# Patient Record
Sex: Male | Born: 1940 | Race: Black or African American | Hispanic: No | Marital: Married | Smoking: Never smoker
Health system: Southern US, Community
[De-identification: ages and names within clinical notes are randomized; demographics above are authoritative.]

## PROBLEM LIST (undated history)

## (undated) DIAGNOSIS — E119 Type 2 diabetes mellitus without complications: Secondary | ICD-10-CM

## (undated) DIAGNOSIS — E039 Hypothyroidism, unspecified: Secondary | ICD-10-CM

## (undated) HISTORY — PX: HERNIA REPAIR: SHX51

## (undated) HISTORY — PX: APPENDECTOMY: SHX54

## (undated) HISTORY — PX: THYROID SURGERY: SHX805

---

## 2006-06-01 ENCOUNTER — Emergency Department (HOSPITAL_COMMUNITY): Admission: EM | Admit: 2006-06-01 | Discharge: 2006-06-01 | Payer: Self-pay | Admitting: Emergency Medicine

## 2007-03-09 ENCOUNTER — Ambulatory Visit (HOSPITAL_COMMUNITY): Admission: RE | Admit: 2007-03-09 | Discharge: 2007-03-10 | Payer: Self-pay | Admitting: Urology

## 2007-03-21 ENCOUNTER — Inpatient Hospital Stay (HOSPITAL_COMMUNITY): Admission: EM | Admit: 2007-03-21 | Discharge: 2007-03-26 | Payer: Self-pay | Admitting: Emergency Medicine

## 2008-03-03 ENCOUNTER — Ambulatory Visit (HOSPITAL_BASED_OUTPATIENT_CLINIC_OR_DEPARTMENT_OTHER): Admission: RE | Admit: 2008-03-03 | Discharge: 2008-03-03 | Payer: Self-pay | Admitting: Urology

## 2010-11-05 NOTE — Op Note (Signed)
NAME:  [name], BRANDER NO.:  [id number]   MEDICAL RECORD NO.:  [id number]          PATIENT TYPE:  AMB   LOCATION:  [facility]                         FACILITY:  [facility]   PHYSICIAN:  Sigmund I. Patsi Sears, M.D.DATE OF BIRTH:  [birth year]   DATE OF PROCEDURE:  03/03/2008  DATE OF DISCHARGE:                               OPERATIVE REPORT   PREOPERATIVE DIAGNOSIS:  Malfunctioning three-piece inflatable penile  prosthesis.   POSTOPERATIVE DIAGNOSIS:  Malfunctioning three-piece inflatable penile  prosthesis, history of Peyronie's disease and organic erectile  dysfunction.   ATTENDING PHYSICIAN:  Sigmund I. Patsi Sears, M.D.   RESIDENT PHYSICIAN:  Dr. Delman Kitten.   ANESTHESIA:  General plus local.   INDICATIONS FOR PROCEDURE:  Mr. [name] is a 70 year old male with a  history of erectile dysfunction resistant to PD5 inhibitors as well as  testosterone supplementation for hypogonadism.  He also has a history of  diabetes.  He failed injection therapy as well.  He elected to have a  three-piece Mentor inflatable prosthesis placed.  This was done over a  year ago.  Postoperative course was complicated by pyelonephritis.  He  was unhappy with his urologist who implanted this device and likewise  saw Dr. Patsi Sears for a second opinion.  He complained that he had  persistent scrotal discomfort as well as had significant difficulty  inflating the prosthesis.  He was seen in clinic and there Dr.  Patsi Sears and Dr. Brunilda Payor both had difficulty inflating the prosthesis and  it was found that the corporal cylinders were slightly proximal to the  glans penis possibly secondary to cylinder migration.  After discussion  with the patient about his options [name] elected to proceed forth  with explantation of his inflatable prosthesis with reimplantation of a  Mentor malleable prosthesis, concomitantly.  Discussion of risks,  benefits, consequences and concerns.  Informed consent was  obtained.   PROCEDURE IN DETAIL:  The patient was brought to the operating room and  placed in a supine position.  He was correctly identified by his  wristband and appropriate time-out was taken.  IV antibiotics were  administered and general anesthesia was delivered.  Once adequately  anesthetized his groin was clipped, prepped and draped in normal sterile  fashion.  This included a standard 10 minute scrub.  We began by placing  a 16 French Foley catheter per urethra into his bladder and drained  clear urine.  We then reevaluated him under anesthesia.  It did appear  as if the cylinders were both of equal length and both extended to the  corona of his glans when properly inflated.  Under anesthesia we did not  have any difficulty fully inflating it.  However, there did not appear  to be crossover or any other defect in the prosthesis.  We then made our  infrapubic incision through the prior scar.  We carried this down  sharply and bluntly and with Bovie electrocautery.  We identified all  three sets of tubing for the prosthesis.  We began deeper dissection by  following the tubing going to the reservoir.  This was fairly  deep and  quite scarred in.  The defect in the transversalis fascia was almost  completely closed.  Dissecting the reservoir out took a fair amount of  time and diligence.  Once we were able to get into the transversalis  fascia it was opened with a fingertip and enlarged bluntly.  The  reservoir was removed in its entirety.  All bleeding sites were then  addressed with Bovie electrocautery.  This wound was irrigated copiously  with antibiotic solution and the fascial defect was closed with multiple  sutures using 2-0 Vicryl.  We then turned our attention to the pump.   We isolated the two sets of tubing going down to it and again used Bovie  electrocautery to divide the tissues.  We then delivered the pump into  the field as well.  We then followed the tubing going  to each corporal  cylinder and subsequently used Bovie electrocautery to divide all soft  tissues leading to each one of them.  We enlarged both corporotomies and  subsequently delivered both corporal cylinders.  Now that we had  completely explanted the device was passed off the table.  We copiously  irrigated the reservoir site, the right hemiscrotal pump site as well as  both corporal channels with antibiotic solution.  We then changed gloves  as did the scrub nurse.  We then proceeded forth with our measurements,  first the right then the left.  Both measured 20 cm total, approximately  10 cm proximal and 10 cm distal.  We elected to start with a 20 cm  Coloplast semi-rigid penile prosthesis.  We had to extend our  corporotomies on both sides to allow [place] to place our prosthetic  cylinders.  After both sides were placed it appeared as if the distal  tips did not extend far enough out into his phallus and left him a  little short, similar to the location where his IPP tips were.  Likewise  we removed both cylinders and removed both malleable devices and used  the 0.5 cm rear tip extenders for a total of 20.5 cm bilaterally.  We  replaced these into their respective corporal channels and we got the  fit that we were looking for.  There was no bowing or flexing proximally  nor was there excessive tension on the distal penile skin.  We  subsequently irrigated our entire operative field with antibiotic  solution.  We closed our corporotomies with running 2-0 Vicryl and then  closed our wound in three layers, deep layer reapproximated all the  tissues with 4-0 Vicryl.  The adipose/Scarpa's layer was closed with  multiple simple interrupted sutures using 4-0 Vicryl and a few single  interrupted sutures using 2-0 Vicryl.  The skin was then closed with  running 4-0 Monocryl subcuticular and then dressed with Benzoin and  Steri-Strips.  A fluff dressing and scrotal support were then applied.   This marked the end of our procedure.  He awoke from anesthesia and was  taken to the recovery room in stable condition.  There were no  complications.  He tolerated the procedure well.  Dr. Patsi Sears was  present and participated in all aspects of the case.     ______________________________  Dr [place] Constable I. Patsi Sears, M.D.  Electronically Signed    DW/MEDQ  D:  03/03/2008  T:  03/04/2008  Job:  [id number]

## 2010-11-05 NOTE — H&P (Signed)
[name], [name] NO.:  [id number]   MEDICAL RECORD NO.:  [id number]          PATIENT TYPE:  INP   LOCATION:  1424                         FACILITY:  [facility]   PHYSICIAN:  Theresia Bough, MD       DATE OF BIRTH:  06-07-1941   DATE OF ADMISSION:  03/20/2007  DATE OF DISCHARGE:                              HISTORY & PHYSICAL   PRESENTING COMPLAINT:  Fever.   HISTORY OF THE PRESENT ILLNESS:  This is a 70 year old black male  patient who comes into the hospital because of a fever.  He has also  been having some weakness.  His temperature is 101.4 when he came into  the hospital.  He states he has been feeling tired and uncomfortable  since this morning.  The patient states he had surgery for a penile  implant on March 09, 2007.  The patient complains of elevated blood  glucose; he has a history of diabetes.  He denies nausea; and, has had  no vomiting and no diarrhea.  No chest pain.  No shortness of breath.  No abdominal pain.  The patient denies any dysuria and denies cyanosis.  No leg swelling.  No edema.   PAST MEDICAL HISTORY:  1. Diabetes mellitus Type 2.  2. Hypertension.   FAMILY HISTORY:  The family history is noncontributory.   ALLERGIES:  The patient has an allergy to SULFA.   SOCIAL HISTORY:  The patient denies any drug use or alcohol use;  however, is an occasional drinker.   CURRENT MEDICATIONS:  The patient takes Lantus and Benicar; however, he  does not remember the dosages.  He does not remember any other  medications.   PHYSICAL EXAMINATION:  VITAL SIGNS:  The physical examination shows a  temperature of 99.1-101.4, blood pressure 92/59 now and up to 121/61.  HEAD AND NECK:  The patient has pink conjunctivae.  He has no jaundice.  The neck is supple.  Mucosa is moist.  CHEST:  The chest is clear to auscultation bilaterally.  No wheezes and  no crackles.  HEART:  Cardiovascular shows normal heart sounds.  ABDOMEN:  The abdomen is soft  and nontender.  No hepatosplenomegaly.  The patient has normal bowel sounds.  EXTREMITIES:  The extremities reveal no edema.  No cyanosis or clubbing.  NEUROLOGIC EXAMINATION:  The patient is alert and oriented times three.  Speech is clear.  Power is 4/4 in all extremities.   LABORATORY DATA:  The initial blood work shows WBC of 23,000, hemoglobin  12 and platelets 270,000.  BUN 26, creatinine 2.7, glucose 290, which is  elevated, chloride 100, bicarb 24, sodium 133, and potassium 4.9.  Chest  x-ray shows minimal bronchitic changes.  Troponin level 0.05, CK/MB less  than 1.0 and myoglobin 90.   ASSESSMENT:  1. Urosepsis.  2. Renal insufficiency.  3. Leukocytosis.  4. Diabetes mellitus, uncontrolled.   PLAN:  My plan is to:  1. Admit the patient to a telemetry floor.  2. I am going to hydrate the patient with normal saline to see if the  creatinine  level will decrease.  3. I will also do a renal ultrasound in the A.M. to assess the      kidneys.  4. The patient will be started on IV Rocephin 1 gram 24 hours.  5. The patient will also be on Lantus and sliding scale insulin in      order to control his diabetes.  6. The patient will be placed on strict I&Os in order to follow the      patient's renal status.  7. I will also obtain a list of the patient's home medications in the      A.M. so that we can continue them ASAP.  8. Also I will have urine and blood cultures done.  9. We will also follow the patient's WBC.      Theresia Bough, MD  Electronically Signed     GA/MEDQ  D:  03/21/2007  T:  03/21/2007  Job:  [id number]

## 2010-11-05 NOTE — Op Note (Signed)
NAMEOSMANI, KERSTEN NO.:  [id number]   MEDICAL RECORD NO.:  [id number]          PATIENT TYPE:  AMB   LOCATION:  DAY                          FACILITY:  [facility]   PHYSICIAN:  Jamison Neighbor, M.D.  DATE OF BIRTH:  [birth year]   DATE OF PROCEDURE:  03/09/2007  DATE OF DISCHARGE:                               OPERATIVE REPORT   PREOPERATIVE DIAGNOSIS:  Erectile dysfunction.   POSTOPERATIVE DIAGNOSES:  1. Erectile dysfunction.  2. Peyronie disease.   PROCEDURE:  Infrapubic implant of inflatable penile prosthesis, three-  piece, with penile remodeling.   SURGEON:  Jamison Neighbor, MD   ASSISTANT:  Melina Schools, MD   ANESTHESIA:  General.   INDICATIONS FOR PROCEDURE:  Mr. Vacha is a 70 year old male with  diabetes and hypogonadism.  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  He has also failed injections.  He has elected for  inflatable penile prosthesis.   DESCRIPTION OF PROCEDURE IN DETAIL:  The patient was brought to the  operating room.  He was identified by his arm band.  Informed consent  was verified and the preoperative time-out was performed.  After the  successful induction of general anesthesia, the patient underwent a  shave of his genitalia and perineal region.  A 10-minute preoperative  scrub was undertaken.  Then the operative site was prepped and draped in  the usual fashion with Betadine.  A Foley catheter was inserted  transurethrally in the bladder and the bladder was drained.  A 6-cm  Infrapubic incision was then made.  Blunt and sharp dissection were used  to carry this down to the level of the corpora.  We placed stay sutures  in the corpora bilaterally and made a 1-cm arthrotomy.  We dilated  proximally with Metzenbaums and distally as well.  We then used  sequential Hegar dilators to dilate to a size of 14 [place].  Great care  was taken not to perforate  posteriorly or cross-dilate.  Once dilation  was complete on both sides, we used the measuring tool to select the  appropriate size.  Our dilation measured 18 cm on both sides.  Therefore  we used a 16-cm device with the 2-cm rear-tip extenders.  Once we had  dilated, we placed preplaced closing sutures in the corpora bilaterally  using 2-0 Vicryl.  With the dilation complete, we tested with putting  two dilators in both proximal and then in both distal dilations and  there was no cross-perforation.  We then created the space for the  reservoir by using blunt and sharp dissection anterior to the internal  ring and perforate the peritoneum.  We then developed the scrotal pump  site using blunt dissection to a dependent portion of the right  hemiscrotum.  Having done all this, we proceeded to implant of the  device.  The wound was vigorously irrigated with antibiotic solution.  We then placed the above-mentioned device using the [facility] tool.  We  passed the [place] needles through the glans and then placed the posterior  expanders.  We then placed the reservoir under direct vision through the  internal ring.  We then placed the pump in the scrotum.  We used the  connector.  We tested the device and there was no reservoir auto-fill.  The cylinders field excellently.  We then again irrigated.  The wound  was closed in two layers with 2-0 Vicryl and the skin was closed with  running 4-0 Vicryl in a subcuticular fashion.  It was dressed with an  occlusive dressing.  The patient tolerated the procedure well and there  were no complications.  Jamison Neighbor, M.D., was the attending and  primary responsible physician, present and participating in all aspects  of the procedure.   DISPOSITION:  The patient was awoken from general anesthetic and  transferred safely to the post anesthesia care unit in stable condition.     ______________________________  Melina Schools, MD      Jamison Neighbor,  M.D.  Electronically Signed    JR/MEDQ  D:  03/09/2007  T:  03/09/2007  Job:  [id number]

## 2010-11-08 NOTE — Discharge Summary (Signed)
NAMECHRISTY, [name] NO.:  [id number]   MEDICAL RECORD NO.:  [id number]          PATIENT TYPE:  INP   LOCATION:  1424                         [facility]ACILITY:  [facility]   PHYSICIAN:  Michaelyn Barter, M.D. DATE OF BIRTH:  1941-06-15   DATE OF ADMISSION:  03/20/2007  DATE OF DISCHARGE:  03/26/2007                               DISCHARGE SUMMARY   PRIMARY CARE PHYSICIAN:  Unassigned.   FINAL DIAGNOSIS:  Pyelonephritis with urine cultures positive for  Pseudomonas.   PROCEDURES:  1. Chest x-ray, two views, completed March 20, 2007.  2. His renal ultrasound completed March 21, 2007.   CONSULTATIONS:  Urology with Dr. Logan Bores   HISTORY OF PRESENT ILLNESS:  [name] is a 70 year old gentleman who  indicated that he had developed a fever and began to experience weakness  while at home.  He had recently undergone a penile implant surgery on  September 16.  He also stated that his sugars have been running somewhat  high at home.  For past medical history, please see that dictated by Dr.  __________ .   HOSPITAL COURSE:  1. Pyelonephritis.  The patient had a urinalysis completed on      September 27.  It revealed moderate leukocytes.  Wbc's were too      numerous to count.  He was started on empiric IV Rocephin.  A urine      culture later grew Pseudomonas aeruginosa.  Over the course of      hospitalization,  his fevers resolved.  A renal ultrasound was      completed on September 28 which revealed small renal cyst      bilaterally.  No acute findings.  2. Recent penile prosthesis implantation secondary to erectile      dysfunction.  Urology was consulted and Dr. Logan Bores saw the patient.      At one point in time, Dr. Logan Bores indicated the patient was at risk      for infection of his prosthesis; therefore, he recommended close      observation.  No further surgical intervention was pursued during      this hospitalization.  3. Diabetes mellitus.  The patient's sugars  were poorly controlled.      The patient indicated that he had a much better control of his      sugars while he was at home than  during this hospitalization.  His      Lantus insulin was titrated over the course of hospitalization in      an effort to better controlled his sugars, although by the date of      discharge they were not perfectly controlled.  His hemoglobin A1c      was found to be 8.5.  4. Renal insufficiency.  The patient's BUN was noted to have been 26      with a creatinine of 2.7 at the time of admission.  There may have      been a prerenal component to this with IV fluid hydration.  The      patient's BUN and creatinine both improved  significantly by the day      of discharge so much so that  by October 1, the patient's BUN had      improved to 10 with a creatinine of 1.13.  By the date of      discharge, the patient indicated that he was ready to go home.  He      stated that he felt really good.  His temperature is 98.7, heart      rate 88, respirations 18, blood pressure 149/86, O2 sat 98% on room      air.  The decision was made to discharge the patient home from the      hospital.   DISCHARGE MEDICATIONS:  The patient was discharged on:  1. Ciprofloxacin 250 mg p.o. q.12h.  He was given a prescription for 2      weeks supply.  2. Lantus insulin 20 units at bedtime.  3. Benicar 20 mg p.o. daily.  4. Levoxyl 0.125 mg p.o. daily.  5. Lovastatin 40 mg p.o. daily.   FOLLOW UP:  He was told to call his regular doctor within 2-4 weeks for  follow-up and to call Dr. Logan Bores for follow-up appointment which would  take place on April 05, 2007.  This is      Michaelyn Barter, M.D.  Electronically Signed     OR/MEDQ  D:  04/25/2007  T:  04/26/2007  Job:  [id number]

## 2011-03-26 LAB — GLUCOSE, CAPILLARY
Glucose-Capillary: 175 — ABNORMAL HIGH
Glucose-Capillary: 197 — ABNORMAL HIGH

## 2011-03-26 LAB — POCT I-STAT 4, (NA,K, GLUC, HGB,HCT)
Glucose, Bld: 104 — ABNORMAL HIGH
Hemoglobin: 13.9

## 2011-04-03 LAB — CULTURE, BLOOD (ROUTINE X 2): Culture: NO GROWTH

## 2011-04-03 LAB — URINALYSIS, ROUTINE W REFLEX MICROSCOPIC
Glucose, UA: NEGATIVE
Nitrite: NEGATIVE
Specific Gravity, Urine: 1.022
pH: 5

## 2011-04-03 LAB — CBC
HCT: 33.7 — ABNORMAL LOW
HCT: 33.9 — ABNORMAL LOW
HCT: 35 — ABNORMAL LOW
Hemoglobin: 11.2 — ABNORMAL LOW
Hemoglobin: 11.4 — ABNORMAL LOW
Hemoglobin: 11.6 — ABNORMAL LOW
Hemoglobin: 10.8 — ABNORMAL LOW
Hemoglobin: 11.1 — ABNORMAL LOW
MCHC: 33.6
MCHC: 33.9
MCHC: 33.9
MCV: 88
MCV: 87.7
Platelets: 308
Platelets: 223
Platelets: 248
RBC: 3.82 — ABNORMAL LOW
RBC: 3.85 — ABNORMAL LOW
RBC: 3.59 — ABNORMAL LOW
RDW: 14
RDW: 14.2 — ABNORMAL HIGH
RDW: 13.9
RDW: 14
RDW: 14.6 — ABNORMAL HIGH
WBC: 10.5
WBC: 19.1 — ABNORMAL HIGH
WBC: 25 — ABNORMAL HIGH

## 2011-04-03 LAB — DIFFERENTIAL
Basophils Absolute: 0
Basophils Absolute: 0
Basophils Absolute: 0
Basophils Relative: 0
Eosinophils Relative: 0
Lymphocytes Relative: 10 — ABNORMAL LOW
Lymphocytes Relative: 10 — ABNORMAL LOW
Lymphocytes Relative: 4 — ABNORMAL LOW
Lymphocytes Relative: 6 — ABNORMAL LOW
Lymphs Abs: 0.9
Lymphs Abs: 1.8
Monocytes Absolute: 1.1 — ABNORMAL HIGH
Monocytes Absolute: 1.4 — ABNORMAL HIGH
Monocytes Relative: 6
Monocytes Relative: 7
Monocytes Relative: 8
Neutro Abs: 10.3 — ABNORMAL HIGH
Neutro Abs: 15.8 — ABNORMAL HIGH
Neutro Abs: 20.7 — ABNORMAL HIGH
Neutro Abs: 21.5 — ABNORMAL HIGH
Neutrophils Relative %: 80 — ABNORMAL HIGH
Neutrophils Relative %: 86 — ABNORMAL HIGH

## 2011-04-03 LAB — BASIC METABOLIC PANEL
BUN: 29 — ABNORMAL HIGH
Calcium: 8.7
Creatinine, Ser: 1.13
Creatinine, Ser: 2.02 — ABNORMAL HIGH
GFR calc Af Amer: >60
GFR calc non Af Amer: >60
GFR calc non Af Amer: 33 — ABNORMAL LOW
Glucose, Bld: 157 — ABNORMAL HIGH
Glucose, Bld: 193 — ABNORMAL HIGH
Potassium: 4.7
Sodium: 140

## 2011-04-03 LAB — COMPREHENSIVE METABOLIC PANEL
ALT: 49
AST: 37
CO2: 24
Calcium: 8.7
Creatinine, Ser: 2.7 — ABNORMAL HIGH
GFR calc Af Amer: 29 — ABNORMAL LOW
GFR calc non Af Amer: 24 — ABNORMAL LOW
Sodium: 133 — ABNORMAL LOW
Total Protein: 6.7

## 2011-04-03 LAB — POCT CARDIAC MARKERS
CKMB, poc: <1 — ABNORMAL LOW
Myoglobin, poc: 395
Operator id: 1211

## 2011-04-03 LAB — URINE MICROSCOPIC-ADD ON

## 2011-04-03 LAB — HEMOGLOBIN A1C: Hgb A1c MFr Bld: 8.5 — ABNORMAL HIGH

## 2011-04-03 LAB — URINE CULTURE

## 2011-04-04 LAB — NO BLOOD PRODUCTS

## 2011-04-04 LAB — URINALYSIS, ROUTINE W REFLEX MICROSCOPIC
Bilirubin Urine: NEGATIVE
Hgb urine dipstick: NEGATIVE
Specific Gravity, Urine: 1.019
pH: 6

## 2011-04-04 LAB — BASIC METABOLIC PANEL
CO2: 28
GFR calc Af Amer: >60
GFR calc non Af Amer: 53 — ABNORMAL LOW
Glucose, Bld: 128 — ABNORMAL HIGH
Potassium: 4.3
Sodium: 139

## 2011-04-04 LAB — HEMOGLOBIN AND HEMATOCRIT, BLOOD: HCT: 38.8 — ABNORMAL LOW

## 2014-09-19 ENCOUNTER — Emergency Department (HOSPITAL_COMMUNITY): Payer: Medicare Other

## 2014-09-19 ENCOUNTER — Encounter (HOSPITAL_COMMUNITY): Payer: Self-pay | Admitting: *Deleted

## 2014-09-19 ENCOUNTER — Encounter (HOSPITAL_COMMUNITY)
Admission: EM | Disposition: A | Discharge status: Home / Self Care | Payer: Medicare Other | Source: Home / Self Care | Attending: Cardiovascular Disease

## 2014-09-19 ENCOUNTER — Other Ambulatory Visit: Payer: Self-pay

## 2014-09-19 ENCOUNTER — Inpatient Hospital Stay (HOSPITAL_COMMUNITY)
Admission: EM | Admit: 2014-09-19 | Discharge: 2014-09-21 | DRG: 247 | Disposition: A | Discharge status: Home / Self Care | Payer: Medicare Other | Attending: Cardiovascular Disease | Admitting: Cardiovascular Disease

## 2014-09-19 DIAGNOSIS — I2510 Atherosclerotic heart disease of native coronary artery without angina pectoris: Secondary | ICD-10-CM

## 2014-09-19 DIAGNOSIS — E1165 Type 2 diabetes mellitus with hyperglycemia: Secondary | ICD-10-CM | POA: Diagnosis present

## 2014-09-19 DIAGNOSIS — I10 Essential (primary) hypertension: Secondary | ICD-10-CM | POA: Diagnosis present

## 2014-09-19 DIAGNOSIS — E785 Hyperlipidemia, unspecified: Secondary | ICD-10-CM | POA: Diagnosis present

## 2014-09-19 DIAGNOSIS — I2119 ST elevation (STEMI) myocardial infarction involving other coronary artery of inferior wall: Secondary | ICD-10-CM | POA: Diagnosis present

## 2014-09-19 DIAGNOSIS — E871 Hypo-osmolality and hyponatremia: Secondary | ICD-10-CM | POA: Diagnosis present

## 2014-09-19 DIAGNOSIS — I472 Ventricular tachycardia: Secondary | ICD-10-CM | POA: Diagnosis not present

## 2014-09-19 DIAGNOSIS — Z881 Allergy status to other antibiotic agents status: Secondary | ICD-10-CM | POA: Diagnosis not present

## 2014-09-19 DIAGNOSIS — E039 Hypothyroidism, unspecified: Secondary | ICD-10-CM | POA: Diagnosis present

## 2014-09-19 DIAGNOSIS — I2121 ST elevation (STEMI) myocardial infarction involving left circumflex coronary artery: Secondary | ICD-10-CM | POA: Diagnosis not present

## 2014-09-19 DIAGNOSIS — E119 Type 2 diabetes mellitus without complications: Secondary | ICD-10-CM | POA: Diagnosis not present

## 2014-09-19 DIAGNOSIS — Z794 Long term (current) use of insulin: Secondary | ICD-10-CM

## 2014-09-19 DIAGNOSIS — I213 ST elevation (STEMI) myocardial infarction of unspecified site: Secondary | ICD-10-CM

## 2014-09-19 DIAGNOSIS — R739 Hyperglycemia, unspecified: Secondary | ICD-10-CM

## 2014-09-19 HISTORY — PX: CARDIAC CATHETERIZATION: SHX172

## 2014-09-19 HISTORY — DX: Type 2 diabetes mellitus without complications: E11.9

## 2014-09-19 HISTORY — DX: Hypothyroidism, unspecified: E03.9

## 2014-09-19 HISTORY — PX: PERCUTANEOUS CORONARY STENT INTERVENTION (PCI-S): SHX5485

## 2014-09-19 LAB — CBC
HCT: 36.1 % — ABNORMAL LOW (ref 39.0–52.0)
HCT: 35.6 % — ABNORMAL LOW (ref 39.0–52.0)
Hemoglobin: 12.2 g/dL — ABNORMAL LOW (ref 13.0–17.0)
Hemoglobin: 12.2 g/dL — ABNORMAL LOW (ref 13.0–17.0)
MCH: 30.6 pg (ref 26.0–34.0)
MCH: 30.7 pg (ref 26.0–34.0)
MCHC: 33.8 g/dL (ref 30.0–36.0)
MCHC: 34.3 g/dL (ref 30.0–36.0)
MCV: 90.5 fL (ref 78.0–100.0)
MCV: 89.4 fL (ref 78.0–100.0)
PLATELETS: 190 10*3/uL (ref 150–400)
Platelets: 180 10*3/uL (ref 150–400)
RBC: 3.99 MIL/uL — AB (ref 4.22–5.81)
RBC: 3.98 MIL/uL — AB (ref 4.22–5.81)
RDW: 12.3 % (ref 11.5–15.5)
RDW: 12.4 % (ref 11.5–15.5)
WBC: 7.4 10*3/uL (ref 4.0–10.5)
WBC: 6.8 10*3/uL (ref 4.0–10.5)

## 2014-09-19 LAB — BRAIN NATRIURETIC PEPTIDE: B Natriuretic Peptide: 26 pg/mL (ref 0.0–100.0)

## 2014-09-19 LAB — TSH: TSH: 0.412 u[IU]/mL (ref 0.350–4.500)

## 2014-09-19 LAB — BASIC METABOLIC PANEL
ANION GAP: 9 (ref 5–15)
BUN: 19 mg/dL (ref 6–23)
CALCIUM: 9 mg/dL (ref 8.4–10.5)
CHLORIDE: 97 mmol/L (ref 96–112)
CO2: 25 mmol/L (ref 19–32)
Creatinine, Ser: 1.29 mg/dL (ref 0.50–1.35)
GFR, EST AFRICAN AMERICAN: 61 mL/min — AB (ref >90)
GFR, EST NON AFRICAN AMERICAN: 53 mL/min — AB (ref >90)
GLUCOSE: 359 mg/dL — AB (ref 70–99)
Potassium: 3.8 mmol/L (ref 3.5–5.1)
SODIUM: 131 mmol/L — AB (ref 135–145)

## 2014-09-19 LAB — GLUCOSE, CAPILLARY
GLUCOSE-CAPILLARY: 312 mg/dL — AB (ref 70–99)
GLUCOSE-CAPILLARY: 255 mg/dL — AB (ref 70–99)
GLUCOSE-CAPILLARY: 190 mg/dL — AB (ref 70–99)
Glucose-Capillary: 182 mg/dL — ABNORMAL HIGH (ref 70–99)

## 2014-09-19 LAB — CREATININE, SERUM
Creatinine, Ser: 1.22 mg/dL (ref 0.50–1.35)
GFR calc non Af Amer: 57 mL/min — ABNORMAL LOW (ref >90)
GFR, EST AFRICAN AMERICAN: 66 mL/min — AB (ref >90)

## 2014-09-19 LAB — TROPONIN I
Troponin I: 8.8 ng/mL (ref <0.031)
Troponin I: 50.87 ng/mL (ref <0.031)
Troponin I: 42.95 ng/mL (ref <0.031)

## 2014-09-19 LAB — POCT ACTIVATED CLOTTING TIME: Activated Clotting Time: 362 seconds

## 2014-09-19 SURGERY — LEFT HEART CATH AND CORONARY ANGIOGRAPHY

## 2014-09-19 MED ORDER — SODIUM CHLORIDE 0.9 % IV SOLN
1.0000 mL/kg/h | INTRAVENOUS | Status: AC
  Administered 2014-09-19: 1 mL/kg/h via INTRAVENOUS

## 2014-09-19 MED ORDER — LOSARTAN POTASSIUM 50 MG PO TABS
100.0000 mg | ORAL_TABLET | Freq: Every day | ORAL | Status: DC
  Administered 2014-09-20: 100 mg via ORAL
  Filled 2014-09-19 (×2): qty 2

## 2014-09-19 MED ORDER — NITROGLYCERIN 0.4 MG SL SUBL: 0.4000 mg | SUBLINGUAL_TABLET | SUBLINGUAL | Status: DC | PRN

## 2014-09-19 MED ORDER — LIDOCAINE HCL (PF) 1 % IJ SOLN
INTRAMUSCULAR | Status: AC
  Filled 2014-09-19: qty 30

## 2014-09-19 MED ORDER — SODIUM CHLORIDE 0.9 % IJ SOLN
3.0000 mL | INTRAMUSCULAR | Status: DC | PRN
Start: 2014-09-19 — End: 2014-09-21

## 2014-09-19 MED ORDER — MIDAZOLAM HCL 2 MG/2ML IJ SOLN
INTRAMUSCULAR | Status: AC
  Filled 2014-09-19: qty 2

## 2014-09-19 MED ORDER — INSULIN ASPART 100 UNIT/ML ~~LOC~~ SOLN
0.0000 [IU] | Freq: Three times a day (TID) | SUBCUTANEOUS | Status: DC
  Administered 2014-09-19: 11 [IU] via SUBCUTANEOUS
  Administered 2014-09-19: 8 [IU] via SUBCUTANEOUS
  Administered 2014-09-19 – 2014-09-20 (×4): 3 [IU] via SUBCUTANEOUS
  Administered 2014-09-21: 5 [IU] via SUBCUTANEOUS

## 2014-09-19 MED ORDER — FENTANYL CITRATE 0.05 MG/ML IJ SOLN
INTRAMUSCULAR | Status: AC
  Filled 2014-09-19: qty 2

## 2014-09-19 MED ORDER — BIVALIRUDIN 250 MG IV SOLR
INTRAVENOUS | Status: AC
  Filled 2014-09-19: qty 250

## 2014-09-19 MED ORDER — SODIUM CHLORIDE 0.9 % IV SOLN: 250.0000 mL | INTRAVENOUS | Status: DC | PRN

## 2014-09-19 MED ORDER — INSULIN ASPART 100 UNIT/ML ~~LOC~~ SOLN: 0.0000 [IU] | Freq: Every day | SUBCUTANEOUS | Status: DC

## 2014-09-19 MED ORDER — NITROGLYCERIN 0.4 MG SL SUBL
0.4000 mg | SUBLINGUAL_TABLET | SUBLINGUAL | Status: AC | PRN
  Administered 2014-09-19 (×3): 0.4 mg via SUBLINGUAL
  Filled 2014-09-19 (×2): qty 1

## 2014-09-19 MED ORDER — ASPIRIN 81 MG PO CHEW
324.0000 mg | CHEWABLE_TABLET | Freq: Once | ORAL | Status: AC
  Administered 2014-09-19: 324 mg via ORAL
  Filled 2014-09-19: qty 4

## 2014-09-19 MED ORDER — ONDANSETRON HCL 4 MG/2ML IJ SOLN: 4.0000 mg | Freq: Four times a day (QID) | INTRAMUSCULAR | Status: DC | PRN

## 2014-09-19 MED ORDER — ASPIRIN 81 MG PO CHEW
81.0000 mg | CHEWABLE_TABLET | Freq: Every day | ORAL | Status: DC
  Administered 2014-09-19 – 2014-09-21 (×3): 81 mg via ORAL
  Filled 2014-09-19 (×3): qty 1

## 2014-09-19 MED ORDER — TICAGRELOR 90 MG PO TABS
ORAL_TABLET | ORAL | Status: AC
  Filled 2014-09-19: qty 2

## 2014-09-19 MED ORDER — SODIUM CHLORIDE 0.9 % IJ SOLN
3.0000 mL | Freq: Two times a day (BID) | INTRAMUSCULAR | Status: DC
  Administered 2014-09-19 – 2014-09-20 (×4): 3 mL via INTRAVENOUS

## 2014-09-19 MED ORDER — METOPROLOL TARTRATE 12.5 MG HALF TABLET
12.5000 mg | ORAL_TABLET | Freq: Two times a day (BID) | ORAL | Status: DC
  Administered 2014-09-19 (×2): 12.5 mg via ORAL
  Filled 2014-09-19 (×4): qty 1

## 2014-09-19 MED ORDER — ACETAMINOPHEN 325 MG PO TABS: 650.0000 mg | ORAL_TABLET | ORAL | Status: DC | PRN

## 2014-09-19 MED ORDER — ATORVASTATIN CALCIUM 80 MG PO TABS
80.0000 mg | ORAL_TABLET | Freq: Every day | ORAL | Status: DC
  Administered 2014-09-19 – 2014-09-20 (×2): 80 mg via ORAL
  Filled 2014-09-19 (×3): qty 1

## 2014-09-19 MED ORDER — HEPARIN BOLUS VIA INFUSION
4000.0000 [IU] | Freq: Once | INTRAVENOUS | Status: AC
  Administered 2014-09-19: 4000 [IU] via INTRAVENOUS

## 2014-09-19 MED ORDER — HEPARIN (PORCINE) IN NACL 100-0.45 UNIT/ML-% IJ SOLN: 1000.0000 [IU]/h | INTRAMUSCULAR | Status: DC

## 2014-09-19 MED ORDER — HEPARIN (PORCINE) IN NACL 2-0.9 UNIT/ML-% IJ SOLN
INTRAMUSCULAR | Status: AC
  Filled 2014-09-19: qty 1500

## 2014-09-19 MED ORDER — LEVOTHYROXINE SODIUM 125 MCG PO TABS
125.0000 ug | ORAL_TABLET | Freq: Every day | ORAL | Status: DC
  Administered 2014-09-19 – 2014-09-21 (×3): 125 ug via ORAL
  Filled 2014-09-19 (×6): qty 1

## 2014-09-19 MED ORDER — INSULIN DETEMIR 100 UNIT/ML ~~LOC~~ SOLN
25.0000 [IU] | Freq: Two times a day (BID) | SUBCUTANEOUS | Status: DC
  Administered 2014-09-19 (×2): 25 [IU] via SUBCUTANEOUS
  Filled 2014-09-19 (×5): qty 0.25

## 2014-09-19 MED ORDER — HEPARIN SODIUM (PORCINE) 5000 UNIT/ML IJ SOLN
5000.0000 [IU] | Freq: Three times a day (TID) | INTRAMUSCULAR | Status: DC
  Administered 2014-09-19 – 2014-09-21 (×6): 5000 [IU] via SUBCUTANEOUS
  Filled 2014-09-19 (×10): qty 1

## 2014-09-19 MED ORDER — TICAGRELOR 90 MG PO TABS
90.0000 mg | ORAL_TABLET | Freq: Two times a day (BID) | ORAL | Status: DC
  Administered 2014-09-19 – 2014-09-21 (×4): 90 mg via ORAL
  Filled 2014-09-19 (×6): qty 1

## 2014-09-19 MED ORDER — NITROGLYCERIN 1 MG/10 ML FOR IR/CATH LAB
INTRA_ARTERIAL | Status: AC
  Filled 2014-09-19: qty 10

## 2014-09-19 MED ORDER — OXYCODONE-ACETAMINOPHEN 5-325 MG PO TABS: 1.0000 | ORAL_TABLET | ORAL | Status: DC | PRN

## 2014-09-19 MED ORDER — HEPARIN (PORCINE) IN NACL 100-0.45 UNIT/ML-% IJ SOLN
INTRAMUSCULAR | Status: AC
  Administered 2014-09-19: 25000 [IU]
  Filled 2014-09-19: qty 250

## 2014-09-19 NOTE — Progress Notes (Signed)
CRITICAL VALUE ALERT  Critical value received: Troponin 50.87  Date of notification:  09/19/14  Critical value read back:Yes.    Nurse who received alert:  Stanton Kidneyeal Ariane Ditullio  MD notified (1st page):  Erlene QuanJ. Berry  Responding MD: Erlene QuanJ. Berry  To be expected. Pt has no SOB, no chest pain. Will continue to monitor.   Rise PaganiniURRY, Xin Klawitter R, RN

## 2014-09-19 NOTE — Progress Notes (Signed)
Patient back from cath procedure, with TR band band placed to RIGHT RADIAL. TR band removed following protocol. Site level 0, no bleeding. TR band removed without any complications. Tegaderm dressing placed over the site.   Rise PaganiniURRY, Abia Monaco R, RN

## 2014-09-19 NOTE — ED Notes (Signed)
Pt c/o upper back pain, between shoulder blades and states he has had indigestion and has vomited x 1; pt states he feels weak

## 2014-09-19 NOTE — Progress Notes (Signed)
Inpatient Diabetes Program Recommendations  AACE/ADA: New Consensus Statement on Inpatient Glycemic Control (2013)  Target Ranges:  Prepandial:   less than 140 mg/dL      Peak postprandial:   less than 180 mg/dL (1-2 hours)      Critically ill patients:  140 - 180 mg/dL   Results for [name], [name] (MRN [id number]) as of 09/19/2014 08:19  Ref. Range 09/19/2014 03:25  Glucose Latest Range: 70-99 mg/dL 409359 (H)   Reason for Visit: STEMI  Diabetes history: DM 2 Outpatient Diabetes medications: Levemir 35 units BID Current orders for Inpatient glycemic control: Novolog 0-15 units TID, Novolog 0-5 units QHS  Inpatient Diabetes Program Recommendations Insulin - Basal: Patient takes basal insulin at home (Levemir 35 units BID). Initial glucose 359 mg/dl at [id number] this am. Please consider ordering Levemir 25 units BID.  Thanks,  Christena DeemShannon Delle Andrzejewski RN, MSN, [facility] Inpatient Diabetes Coordinator Team Pager [phone]

## 2014-09-19 NOTE — CV Procedure (Signed)
Cardiac Catheterization Procedure Note  Name: [name] MRN: [id number] DOB: 12-29-1940  Procedure: Left Heart Cath, Selective Coronary Angiography, LV angiography, PTCA and stenting of the LCx (proximal and distal  Indication: Inferolateral STEMI  Procedural Details:  The right wrist was prepped, draped, and anesthetized with 1% lidocaine. Using the modified Seldinger technique, a 5/6 French Slender sheath was introduced into the right radial artery. 3 mg of verapamil was administered through the sheath, weight-based unfractionated heparin was administered intravenously. Standard Judkins catheters were used for selective coronary angiography and left ventriculography. Ventriculography was performed following PCI. Catheter exchanges were performed over an exchange length guidewire.  PROCEDURAL FINDINGS Hemodynamics: AO 112/63 LV 117/22   Coronary angiography: Coronary dominance: right  Left mainstem: The left main is patent without obstructive disease.  Left anterior descending (LAD): The LAD is large in caliber. The vessel courses to the left ventricular apex. There are diffuse irregularities noted. There is no significant stenosis throughout the LAD proper. There are scattered 20-30% stenoses after the first septal perforator. The diagonal branches are small with mild ostial stenoses.  Left circumflex (LCx): There is an intermediate branch with mild diffuse irregularity but no high-grade stenosis. The AV circumflex is subtotally occluded proximally. There is TIMI 2 flow beyond the stenosis. After reopening the vessel, the distal AV circumflex before the second obtuse marginal branch has 75% stenosis. The first OM branch is very small with tight 95% ostial stenosis. The second and third obtuse marginal branches are patent.  Right coronary artery (RCA): This is a diffusely diseased vessel distally. The proximal and mid portions of the vessel are patent. The distal vessel has a long  segment diffuse 75-80% stenosis leading into a small PDA branch.  Left ventriculography: There is mild hypokinesis of the distal inferior wall with sparing of the apex. The other LV segments are hyper dynamic. The estimated LVEF is 65%.  PCI Note:  Following the diagnostic procedure, the decision was made to proceed with PCI of the proximal circumflex.  The patient had received IV heparin prior to arrival. He was loaded with brilinta 180 mg orally on the table. Weight-based bivalirudin was given for anticoagulation. Once a therapeutic ACT was achieved, a 6 [place] JL 3.5 cm guide catheter was inserted.  A cougar coronary guidewire was used to cross the lesion.  The lesion was predilated with a 2.5 mm balloon.  The lesion was then stented with a 3.5x18 mm Xience DES (14 atm).  The stent was postdilated with a 3.75 mm noncompliant balloon (14 atm).  Following PCI, there was 0% residual stenosis and TIMI-3 flow. Beyond the lesion in the distal circumflex there was an eccentric 75% stenosis. [place] was primarily stented with a 3.25 x 13 mm Xience DES (14 atm). Final angiography confirmed an excellent result. The patient tolerated the procedure well. There were no immediate procedural complications. A TR band was used for radial hemostasis. The patient was transferred to the post catheterization recovery area for further monitoring.  PCI Data: Lesion 1 Vessel - LCx/Segment - prox Percent Stenosis (pre)  99 TIMI-flow 2 Stent 3.5x18 mm Xience DES Percent Stenosis (post) 0 TIMI-flow (post) 3  Lesion 2 Vessel - LCx/Segment - distal Percent Stenosis (pre)  75 TIMI-flow 3 Stent 3.25x13 mm Xience DES Percent Stenosis (post) 0 TIMI-flow (post) 3  Estimated Blood Loss: minimal  Final Conclusions:   1. Acute inferolateral STEMI secondary to total occlusion of the large left circumflex, treated successfully with primary PCI  2.  Moderately severe diffuse distal RCA stenosis (small caliber distal vessel)  3.  Patent LAD with mild diffuse disease  4. Mild contraction abnormality the left ventricle with overall well-preserved LVEF   Recommendations:  Aggressive medical therapy for risk reduction. Dual antiplatelet therapy with aspirin and brilinta for at least 12 months. Medical therapy for her residual CAD (diffuse distal diabetic pattern). Anticipate hospital discharge in 48 hours unless complications arise.  Sherren Mocha MD, [facility] 09/19/2014, 6:44 AM

## 2014-09-19 NOTE — ED Provider Notes (Signed)
CSN: [id number]     Arrival date & time 09/19/14  0308 History   First MD Initiated Contact with Patient 09/19/14 [phone]     Chief Complaint  Patient presents with  . Shoulder Pain     (Consider location/radiation/quality/duration/timing/severity/associated sxs/prior Treatment) Patient is a 74 y.o. male presenting with shoulder pain. The history is provided by the patient.  Shoulder Pain He had onset about 10 PM of a vague discomfort in the left scapular area without radiation. He rated the discomfort initially at 9/10, but it has subsided to 2/10. It seemed to be worse if he laid down, but nothing made it better. When pressed, he described it as a total pain. There is associated nausea and vomiting and diaphoresis but no dyspnea. He does not recall having had pain like this before although he has had indigestion in the past. He does have cardiac risk factors of hypertension and hyperlipidemia although is a nonsmoker and no history of hypertension.  No past medical history on file. No past surgical history on file. No family history on file. History  Substance Use Topics  . Smoking status: Not on file  . Smokeless tobacco: Not on file  . Alcohol Use: Not on file    Review of Systems  All other systems reviewed and are negative.     Allergies  Review of patient's allergies indicates not on file.  Home Medications   Prior to Admission medications   Not on File   BP 138/78 mmHg  Pulse 73  Temp(Src) 97.6 F (36.4 C) (Oral)  Resp 12  Ht 6\' 1"  (1.854 m)  Wt 240 lb (108.863 kg)  BMI 31.67 kg/m2  SpO2 100% Physical Exam  Nursing note and vitals reviewed.  74 year old male, resting comfortably and in no acute distress. Vital signs are normal. Oxygen saturation is 100%, which is normal. Head is normocephalic and atraumatic. PERRLA, EOMI. Oropharynx is clear. Neck is nontender and supple without adenopathy or JVD. Back is nontender and there is no CVA tenderness. Lungs are  clear without rales, wheezes, or rhonchi. Chest is nontender. Heart has regular rate and rhythm without murmur. Abdomen is soft, flat, nontender without masses or hepatosplenomegaly and peristalsis is normoactive. Extremities have no cyanosis or edema, full range of motion is present. Skin is warm and dry without rash. Neurologic: Mental status is normal, cranial nerves are intact, there are no motor or sensory deficits.  ED Course  Procedures (including critical care time) Labs Review Labs Reviewed - No data to display  Imaging Review No results found.   EKG Interpretation   Date/Time:  Tuesday September 19 2014 03:29:01 EDT Ventricular Rate:  75 PR Interval:  168 QRS Duration: 99 QT Interval:  390 QTC Calculation: 436 R Axis:   31 Text Interpretation:  Sinus rhythm Normal ECG No old tracing to compare  Confirmed by [facility]  MD, Mercadez Heitman ([id number]) on 09/19/2014 3:34:43 AM       EKG Interpretation  Date/Time:  Tuesday September 19 2014 04:12:23 EDT Ventricular Rate:  76 PR Interval:  164 QRS Duration: 98 QT Interval:  380 QTC Calculation: 427 R Axis:   16 Text Interpretation:  Sinus rhythm Inferior infarct, acute (RCA) Lateral leads are also involved Probable RV involvement, suggest recording right precordial leads STEMI When compared with ECG of earlier today, STEMI is now Present Confirmed by [facility]  MD, Jona Erkkila ([id number]) on 09/19/2014 4:25:53 AM      CRITICAL CARE Performed by: AOZHY,QMVHQGLICK,Kynslei Art Total critical care  time: 50 minutes Critical care time was exclusive of separately billable procedures and treating other patients. Critical care was necessary to treat or prevent imminent or life-threatening deterioration. Critical care was time spent personally by me on the following activities: development of treatment plan with patient and/or surrogate as well as nursing, discussions with consultants, evaluation of patient's response to treatment, examination of patient, obtaining history from  patient or surrogate, ordering and performing treatments and interventions, ordering and review of laboratory studies, ordering and review of radiographic studies, pulse oximetry and re-evaluation of patient's condition.  MDM   Final diagnoses:  ST elevation myocardial infarction (STEMI), unspecified artery  Hyperglycemia  Hyponatremia    Left scapular pain which is concerning for possible angina equivalent. ECG shows no ischemic changes. Old records are reviewed and he has no relevant past visits. He will be given aspirin and nitroglycerin. Troponin is pending at this time.  Troponin has come back negative. He was given sublingual nitroglycerin discomfort actually got worse. [place] noticed ST elevation on monitor and repeated ECG will which now shows STEMI. Code STEMI was activated. Case was discussed with Dr. Excell Seltzer of cardiology service who agrees to accept the patient and transferred to [facility] for emergency PCI. Heparin bolus and infusion were initiated   Dione Booze, MD 09/19/14 [phone]

## 2014-09-19 NOTE — Care Management Note (Signed)
    Page 1 of 1   09/19/2014     11:09:54 AM CARE MANAGEMENT NOTE 09/19/2014  Patient:  Burman NievesBURNEY,Colton   Account Number:  [id number]  Date Initiated:  09/19/2014  Documentation initiated by:  Junius CreamerWELL,DEBBIE  Subjective/Objective Assessment:   stemi     Action/Plan:   lives w wife, pcp dr Corwin Levinswilliam smith   Anticipated DC Date:     Anticipated DC Plan:  HOME/SELF CARE      DC Planning Services  CM consult  Medication Assistance      Choice offered to / List presented to:             Status of service:   Medicare Important Message given?   (If response is "NO", the following Medicare IM given date fields will be blank) Date Medicare IM given:   Medicare IM given by:   Date Additional Medicare IM given:   Additional Medicare IM given by:    Discharge Disposition:    Per UR Regulation:  Reviewed for med. necessity/level of care/duration of stay  If discussed at Long Length of Stay Meetings, dates discussed:    Comments:  3/29 1108 debbie Jarquis Walker rn,bsn gave pt 30day free brilinta card.

## 2014-09-19 NOTE — Progress Notes (Signed)
Subjective:  No CP/SOB S/P PCI/Stent LCX in setting of STEMI  Objective:  Temp:  [97.6 F (36.4 C)-98.1 F (36.7 C)] 98.1 F (36.7 C) (03/29 0426) Pulse Rate:  [73-88] 76 (03/29 0525) Resp:  [10-17] 16 (03/29 0426) BP: (104-138)/(57-82) 113/59 mmHg (03/29 0426) SpO2:  [94 %-100 %] 97 % (03/29 0426) Weight:  [240 lb (108.863 kg)] 240 lb (108.863 kg) (03/29 0325) Weight change:   Intake/Output from previous day:    Intake/Output from this shift:    Physical Exam: General appearance: alert and no distress Neck: no adenopathy, no carotid bruit, no JVD, supple, symmetrical, trachea midline and thyroid not enlarged, symmetric, no tenderness/mass/nodules Lungs: clear to auscultation bilaterally Heart: regular rate and rhythm, S1, S2 normal, no murmur, click, rub or gallop Extremities: extremities normal, atraumatic, no cyanosis or edema  Lab Results: Results for orders placed or performed during the hospital encounter of 09/19/14 (from the past 48 hour(s))  CBC     Status: Abnormal   Collection Time: 09/19/14  3:25 AM  Result Value Ref Range   WBC 7.4 4.0 - 10.5 K/uL   RBC 3.99 (L) 4.22 - 5.81 MIL/uL   Hemoglobin 12.2 (L) 13.0 - 17.0 g/dL   HCT 36.1 (L) 39.0 - 52.0 %   MCV 90.5 78.0 - 100.0 fL   MCH 30.6 26.0 - 34.0 pg   MCHC 33.8 30.0 - 36.0 g/dL   RDW 12.3 11.5 - 15.5 %   Platelets 190 150 - 400 K/uL  Basic metabolic panel     Status: Abnormal   Collection Time: 09/19/14  3:25 AM  Result Value Ref Range   Sodium 131 (L) 135 - 145 mmol/L   Potassium 3.8 3.5 - 5.1 mmol/L   Chloride 97 96 - 112 mmol/L   CO2 25 19 - 32 mmol/L   Glucose, Bld 359 (H) 70 - 99 mg/dL   BUN 19 6 - 23 mg/dL   Creatinine, Ser 1.29 0.50 - 1.35 mg/dL   Calcium 9.0 8.4 - 10.5 mg/dL   GFR calc non Af Amer 53 (L) >90 mL/min   GFR calc Af Amer 61 (L) >90 mL/min    Comment: (NOTE) The eGFR has been calculated using the CKD EPI equation. This calculation has not been validated in all clinical  situations. eGFR's persistently <90 mL/min signify possible Chronic Kidney Disease.    Anion gap 9 5 - 15  Troponin I (MHP)     Status: None   Collection Time: 09/19/14  3:25 AM  Result Value Ref Range   Troponin I <0.03 <0.031 ng/mL    Comment:        NO INDICATION OF MYOCARDIAL INJURY.   CBC     Status: Abnormal   Collection Time: 09/19/14  7:46 AM  Result Value Ref Range   WBC 6.8 4.0 - 10.5 K/uL   RBC 3.98 (L) 4.22 - 5.81 MIL/uL   Hemoglobin 12.2 (L) 13.0 - 17.0 g/dL   HCT 35.6 (L) 39.0 - 52.0 %   MCV 89.4 78.0 - 100.0 fL   MCH 30.7 26.0 - 34.0 pg   MCHC 34.3 30.0 - 36.0 g/dL   RDW 12.4 11.5 - 15.5 %   Platelets 180 150 - 400 K/uL    Imaging: Imaging results have been reviewed  Tele; NSR 70s  Assessment/Plan:   1. Principal Problem: 2.   Acute ST elevation myocardial infarction (STEMI) involving other coronary artery of inferior wall 3. Active Problems: 4.  Hypertension 5.   Inferolateral myocardial infarction 6.   Time Spent Directly with Patient:  20 minutes  Length of Stay:  LOS: 0 days   Day 0 inferolateral STEMI Rx with direct intervention LCX (DES X 2). Otherwise non critical disease other vessels, nl LV FXN. No CP. Radial site OK. VSS. On approp meds. Has DM, will adjust insulin/SSI. Can be Fast Tracked. Keep in [place] today. Tx tele tomorrow. [facility]. Prob home Thurs.   Lorretta Harp 09/19/2014, 8:25 AM

## 2014-09-19 NOTE — H&P (Signed)
Patient ID: [name] MRN: [id number], DOB/AGE: [birth year]   Admit date: 09/19/2014   Primary Physician: Corwin LevinsSMITH, WILLIAM, MD Primary Cardiologist: None  Pt. Profile:  74M non-smoker with HTN, HLD, IDDM, and hypothyroidism who presents as a transfer from [facility] as a STEMI.   Problem List  Past Medical History  Diagnosis Date  . Diabetes mellitus without complication   . Hypothyroidism     Past Surgical History  Procedure Laterality Date  . Hernia repair    . Appendectomy    . Thyroid surgery       Allergies  Allergies  Allergen Reactions  . Sulfa Antibiotics     HPI  74M non-smoker with HTN, HLD, IDDM, and hypothyroidism who presents as a transfer from [facility] as a STEMI.   [name] felt left scapular discomfort at 10pm. He had associated nausea, vomiting, and cold sweats. He has had no pain like this before. Initially pain was 9/10 but subsequently improved to 2/10 without intervention. He presented to [facility] for evaluation. Initial ECG was unremarkable. He was given SL NTG and his pain got worse. Repeat ECG @ 4:12am demonstrated NSR, STE in inferior leads and V5-V6 with reciprocal depressions in aVL and V2. [facility]   No history of stroke. Reports remote GI bleed which he attributes to a prostate issue. No GIB for years. No hematuria or ICH. No recent or upcoming procedures.   Home Medications  Prior to Admission medications   Medication Sig Start Date End Date Taking? Authorizing Provider  levothyroxine (SYNTHROID, LEVOTHROID) 125 MCG tablet Take 125 mcg by mouth daily before breakfast.   Yes Historical Provider, MD  losartan (COZAAR) 100 MG tablet Take 100 mg by mouth daily.   Yes Historical Provider, MD  Levemir  Family History  History reviewed. No pertinent family history.  Social History  History   Social History  . Marital Status: Married    Spouse Name: N/A  . Number of Children: N/A  . Years of Education: N/A   Occupational  History  . Not on file.   Social History Main Topics  . Smoking status: Never Smoker   . Smokeless tobacco: Not on file  . Alcohol Use: Yes     Comment: moderate weekly  . Drug Use: Not on file  . Sexual Activity: No   Other Topics Concern  . Not on file   Social History Narrative  . No narrative on file     Review of Systems General:  No chills, fever, weight changes.  Cardiovascular:  No dyspnea on exertion, orthopnea, palpitations, paroxysmal nocturnal dyspnea. Occasional LE edema. Dermatological: No rash, lesions/masses Respiratory: No cough, dyspnea Urologic: No hematuria, dysuria Abdominal:   No nausea, vomiting, diarrhea, bright red blood per rectum, melena, or hematemesis Neurologic:  No visual changes, wkns, changes in mental status. All other systems reviewed and are otherwise negative except as noted above.  Physical Exam  Blood pressure 113/59, pulse 75, temperature 98.1 F (36.7 C), temperature source Oral, resp. rate 16, height 6\' 1"  (1.854 m), weight 108.863 kg (240 lb), SpO2 97 %.  General: Pleasant, NAD Psych: Normal affect. Neuro: Alert and oriented X 3. Moves all extremities spontaneously. HEENT: Normal  Neck: Supple without bruits or JVD. Lungs:  Resp regular and unlabored, CTA. Heart: RRR no s3, s4, or murmurs. Abdomen: Soft, non-tender, non-distended, BS + x 4.  Extremities: No clubbing, cyanosis. DP/PT/Radials 2+ and equal bilaterally. LE with chronic venous stasis changes.  Labs  Troponin (Point of Care Test) No results for input(s): TROPIPOC in the last 72 hours.  Recent Labs  09/19/14 0325  TROPONINI <0.03   Lab Results  Component Value Date   WBC 7.4 09/19/2014   HGB 12.2* 09/19/2014   HCT 36.1* 09/19/2014   MCV 90.5 09/19/2014   PLT 190 09/19/2014    Recent Labs Lab 09/19/14 0325  NA 131*  K 3.8  CL 97  CO2 25  BUN 19  CREATININE 1.29  CALCIUM 9.0  GLUCOSE 359*   No results found for: CHOL, HDL, LDLCALC, TRIG No  results found for: DDIMER   Radiology/Studies  Dg Chest Port 1 View  09/19/2014   CLINICAL DATA:  Acute onset of upper back pain, with vomiting and weakness. Initial encounter.  EXAM: PORTABLE CHEST - 1 VIEW  COMPARISON:  Chest radiograph performed 03/20/2007  FINDINGS: The lungs are well-aerated. Mild vascular congestion is noted. There is no evidence of focal opacification, pleural effusion or pneumothorax.  The cardiomediastinal silhouette is within normal limits. No acute osseous abnormalities are seen.  IMPRESSION: Mild vascular congestion noted; lungs remain grossly clear.   Electronically Signed   By: Roanna Raider M.D.   On: 09/19/2014 04:22    ECG  09/19/14 @ 3:29: NSR 09/19/14@ 4:12: NSR, STE in inferior leads and V5-V6 with reciprocal depressions in aVL and V2.   ASSESSMENT AND PLAN  74M non-smoker with HTN, HLD, IDDM, and hypothyroidism who presents as a transfer [facility] as a [facility].   Plan for emergent angiography and primary PCI.   Neva Seat, MD 09/19/2014, 4:43 AM

## 2014-09-19 NOTE — ED Notes (Signed)
Patient out of department at this time with [facility]

## 2014-09-19 NOTE — ED Notes (Signed)
Patient states "chest discomfort" and upper back/shoulder pain. Patient states he was nauseated and vomited X1

## 2014-09-20 ENCOUNTER — Encounter (HOSPITAL_COMMUNITY): Payer: Self-pay | Admitting: Cardiovascular Disease

## 2014-09-20 DIAGNOSIS — Z794 Long term (current) use of insulin: Secondary | ICD-10-CM

## 2014-09-20 DIAGNOSIS — I10 Essential (primary) hypertension: Secondary | ICD-10-CM

## 2014-09-20 DIAGNOSIS — E119 Type 2 diabetes mellitus without complications: Secondary | ICD-10-CM

## 2014-09-20 LAB — CBC
HCT: 39.5 % (ref 39.0–52.0)
HEMOGLOBIN: 13.3 g/dL (ref 13.0–17.0)
MCH: 30.2 pg (ref 26.0–34.0)
MCHC: 33.7 g/dL (ref 30.0–36.0)
MCV: 89.6 fL (ref 78.0–100.0)
PLATELETS: 215 10*3/uL (ref 150–400)
RBC: 4.41 MIL/uL (ref 4.22–5.81)
RDW: 12.5 % (ref 11.5–15.5)
WBC: 8.6 10*3/uL (ref 4.0–10.5)

## 2014-09-20 LAB — GLUCOSE, CAPILLARY
GLUCOSE-CAPILLARY: 171 mg/dL — AB (ref 70–99)
GLUCOSE-CAPILLARY: 151 mg/dL — AB (ref 70–99)
Glucose-Capillary: 180 mg/dL — ABNORMAL HIGH (ref 70–99)
Glucose-Capillary: 130 mg/dL — ABNORMAL HIGH (ref 70–99)

## 2014-09-20 LAB — BASIC METABOLIC PANEL
Anion gap: 6 (ref 5–15)
BUN: 11 mg/dL (ref 6–23)
CO2: 28 mmol/L (ref 19–32)
CREATININE: 1.21 mg/dL (ref 0.50–1.35)
Calcium: 9.1 mg/dL (ref 8.4–10.5)
Chloride: 102 mmol/L (ref 96–112)
GFR calc Af Amer: 66 mL/min — ABNORMAL LOW (ref >90)
GFR calc non Af Amer: 57 mL/min — ABNORMAL LOW (ref >90)
GLUCOSE: 210 mg/dL — AB (ref 70–99)
Potassium: 3.8 mmol/L (ref 3.5–5.1)
Sodium: 136 mmol/L (ref 135–145)

## 2014-09-20 LAB — LIPID PANEL
Cholesterol: 207 mg/dL — ABNORMAL HIGH (ref 0–200)
HDL: 37 mg/dL — ABNORMAL LOW (ref >39)
LDL Cholesterol: 135 mg/dL — ABNORMAL HIGH (ref 0–99)
TRIGLYCERIDES: 173 mg/dL — AB (ref <150)
Total CHOL/HDL Ratio: 5.6 RATIO
VLDL: 35 mg/dL (ref 0–40)

## 2014-09-20 LAB — HEMOGLOBIN A1C
Hgb A1c MFr Bld: 11.7 % — ABNORMAL HIGH (ref 4.8–5.6)
Mean Plasma Glucose: 289 mg/dL

## 2014-09-20 MED ORDER — METOPROLOL TARTRATE 25 MG PO TABS
25.0000 mg | ORAL_TABLET | Freq: Two times a day (BID) | ORAL | Status: DC
  Administered 2014-09-20 – 2014-09-21 (×3): 25 mg via ORAL
  Filled 2014-09-20 (×3): qty 1

## 2014-09-20 MED ORDER — INSULIN DETEMIR 100 UNIT/ML ~~LOC~~ SOLN
30.0000 [IU] | Freq: Two times a day (BID) | SUBCUTANEOUS | Status: DC
  Administered 2014-09-20 – 2014-09-21 (×3): 30 [IU] via SUBCUTANEOUS
  Filled 2014-09-20 (×4): qty 0.3

## 2014-09-20 MED FILL — Sodium Chloride IV Soln 0.9%: INTRAVENOUS | Qty: 50 | Status: AC

## 2014-09-20 NOTE — Progress Notes (Signed)
CARDIAC REHAB PHASE I   Ed completed with pt and family. Very receptive with appropriate questions. Discussed controlling DM better. Encouraged pt to pursue CRPII once [place] settled in [place]. Got pt stent card from cath lab. [phone]  Elissa LovettReeve, Truda Staub [place] ParkKristan CES, ACSM 09/20/2014 3:01 PM

## 2014-09-20 NOTE — Progress Notes (Signed)
Subjective:  POD #1 Inferolateral STEMI secondary to Total LCX s/p PCI/Stent  With 2 DES radially by Dr. Burt Knack. Denies CP  Objective:  Temp:  [97.7 F (36.5 C)-98.3 F (36.8 C)] 98 F (36.7 C) (03/30 0730) Pulse Rate:  [59-110] 77 (03/30 0800) Resp:  [9-19] 18 (03/30 0730) BP: (115-154)/(64-89) 115/66 mmHg (03/30 0800) SpO2:  [98 %-100 %] 99 % (03/30 0800) Weight change:   Intake/Output from previous day: 03/29 0701 - 03/30 0700 In: 943.6 [P.O.:920; I.V.:23.6] Out: 1825 [Urine:1825]  Intake/Output from this shift: Total I/O In: -  Out: 300 [Urine:300]  Physical Exam: General appearance: alert and no distress Neck: no adenopathy, no carotid bruit, no JVD, supple, symmetrical, trachea midline and thyroid not enlarged, symmetric, no tenderness/mass/nodules Lungs: clear to auscultation bilaterally Heart: regular rate and rhythm, S1, S2 normal, no murmur, click, rub or gallop Extremities: extremities normal, atraumatic, no cyanosis or edema and Right radial puncture site OK  Lab Results: Results for orders placed or performed during the hospital encounter of 09/19/14 (from the past 48 hour(s))  CBC     Status: Abnormal   Collection Time: 09/19/14  3:25 AM  Result Value Ref Range   WBC 7.4 4.0 - 10.5 K/uL   RBC 3.99 (L) 4.22 - 5.81 MIL/uL   Hemoglobin 12.2 (L) 13.0 - 17.0 g/dL   HCT 36.1 (L) 39.0 - 52.0 %   MCV 90.5 78.0 - 100.0 fL   MCH 30.6 26.0 - 34.0 pg   MCHC 33.8 30.0 - 36.0 g/dL   RDW 12.3 11.5 - 15.5 %   Platelets 190 150 - 400 K/uL  Basic metabolic panel     Status: Abnormal   Collection Time: 09/19/14  3:25 AM  Result Value Ref Range   Sodium 131 (L) 135 - 145 mmol/L   Potassium 3.8 3.5 - 5.1 mmol/L   Chloride 97 96 - 112 mmol/L   CO2 25 19 - 32 mmol/L   Glucose, Bld 359 (H) 70 - 99 mg/dL   BUN 19 6 - 23 mg/dL   Creatinine, Ser 1.29 0.50 - 1.35 mg/dL   Calcium 9.0 8.4 - 10.5 mg/dL   GFR calc non Af Amer 53 (L) >90 mL/min   GFR calc Af Amer 61 (L)  >90 mL/min    Comment: (NOTE) The eGFR has been calculated using the CKD EPI equation. This calculation has not been validated in all clinical situations. eGFR's persistently <90 mL/min signify possible Chronic Kidney Disease.    Anion gap 9 5 - 15  Troponin I (MHP)     Status: None   Collection Time: 09/19/14  3:25 AM  Result Value Ref Range   Troponin I <0.03 <0.031 ng/mL    Comment:        NO INDICATION OF MYOCARDIAL INJURY.   POCT Activated clotting time     Status: None   Collection Time: 09/19/14  5:45 AM  Result Value Ref Range   Activated Clotting Time 362 seconds  TSH     Status: None   Collection Time: 09/19/14  7:46 AM  Result Value Ref Range   TSH 0.412 0.350 - 4.500 uIU/mL  Troponin I-(serum)     Status: Abnormal   Collection Time: 09/19/14  7:46 AM  Result Value Ref Range   Troponin I 8.80 (HH) <0.031 ng/mL    Comment:        POSSIBLE MYOCARDIAL ISCHEMIA. SERIAL TESTING RECOMMENDED. REPEATED TO VERIFY CRITICAL RESULT CALLED TO, READ BACK  BY AND VERIFIED WITH: T. CURRY,RN AT 0930 09/19/14 BY ZBEECH.   Hemoglobin A1c     Status: Abnormal   Collection Time: 09/19/14  7:46 AM  Result Value Ref Range   Hgb A1c MFr Bld 11.7 (H) 4.8 - 5.6 %    Comment: (NOTE)         Pre-diabetes: 5.7 - 6.4         Diabetes: >6.4         Glycemic control for adults with diabetes: <7.0    Mean Plasma Glucose 289 mg/dL    Comment: (NOTE) Performed At: [facility] [place], [place] [id number] Lindon Romp MD [id number]   Brain natriuretic peptide     Status: None   Collection Time: 09/19/14  7:46 AM  Result Value Ref Range   B Natriuretic Peptide 26.0 0.0 - 100.0 pg/mL  CBC     Status: Abnormal   Collection Time: 09/19/14  7:46 AM  Result Value Ref Range   WBC 6.8 4.0 - 10.5 K/uL   RBC 3.98 (L) 4.22 - 5.81 MIL/uL   Hemoglobin 12.2 (L) 13.0 - 17.0 g/dL   HCT 35.6 (L) 39.0 - 52.0 %   MCV 89.4 78.0 - 100.0 fL   MCH 30.7 26.0 - 34.0 pg    MCHC 34.3 30.0 - 36.0 g/dL   RDW 12.4 11.5 - 15.5 %   Platelets 180 150 - 400 K/uL  Creatinine, serum     Status: Abnormal   Collection Time: 09/19/14  7:46 AM  Result Value Ref Range   Creatinine, Ser 1.22 0.50 - 1.35 mg/dL   GFR calc non Af Amer 57 (L) >90 mL/min   GFR calc Af Amer 66 (L) >90 mL/min    Comment: (NOTE) The eGFR has been calculated using the CKD EPI equation. This calculation has not been validated in all clinical situations. eGFR's persistently <90 mL/min signify possible Chronic Kidney Disease.   Glucose, capillary     Status: Abnormal   Collection Time: 09/19/14  9:54 AM  Result Value Ref Range   Glucose-Capillary 312 (H) 70 - 99 mg/dL   Comment 1 Capillary Specimen   Glucose, capillary     Status: Abnormal   Collection Time: 09/19/14 12:16 PM  Result Value Ref Range   Glucose-Capillary 255 (H) 70 - 99 mg/dL   Comment 1 Capillary Specimen   Troponin I-(serum)     Status: Abnormal   Collection Time: 09/19/14  1:05 PM  Result Value Ref Range   Troponin I 50.87 (HH) <0.031 ng/mL    Comment:        POSSIBLE MYOCARDIAL ISCHEMIA. SERIAL TESTING RECOMMENDED. REPEATED TO VERIFY CRITICAL VALUE NOTED.  VALUE IS CONSISTENT WITH PREVIOUSLY REPORTED AND CALLED VALUE.   Glucose, capillary     Status: Abnormal   Collection Time: 09/19/14  6:03 PM  Result Value Ref Range   Glucose-Capillary 182 (H) 70 - 99 mg/dL   Comment 1 Capillary Specimen   Troponin I-(serum)     Status: Abnormal   Collection Time: 09/19/14  6:28 PM  Result Value Ref Range   Troponin I 42.95 (HH) <0.031 ng/mL    Comment:        POSSIBLE MYOCARDIAL ISCHEMIA. SERIAL TESTING RECOMMENDED. REPEATED TO VERIFY CRITICAL VALUE NOTED.  VALUE IS CONSISTENT WITH PREVIOUSLY REPORTED AND CALLED VALUE.   Glucose, capillary     Status: Abnormal   Collection Time: 09/19/14  9:23 PM  Result Value Ref Range  Glucose-Capillary 190 (H) 70 - 99 mg/dL   Comment 1 Capillary Specimen   Basic metabolic panel      Status: Abnormal   Collection Time: 09/20/14  2:42 AM  Result Value Ref Range   Sodium 136 135 - 145 mmol/L   Potassium 3.8 3.5 - 5.1 mmol/L   Chloride 102 96 - 112 mmol/L   CO2 28 19 - 32 mmol/L   Glucose, Bld 210 (H) 70 - 99 mg/dL   BUN 11 6 - 23 mg/dL   Creatinine, Ser 1.21 0.50 - 1.35 mg/dL   Calcium 9.1 8.4 - 10.5 mg/dL   GFR calc non Af Amer 57 (L) >90 mL/min   GFR calc Af Amer 66 (L) >90 mL/min    Comment: (NOTE) The eGFR has been calculated using the CKD EPI equation. This calculation has not been validated in all clinical situations. eGFR's persistently <90 mL/min signify possible Chronic Kidney Disease.    Anion gap 6 5 - 15  CBC     Status: None   Collection Time: 09/20/14  2:42 AM  Result Value Ref Range   WBC 8.6 4.0 - 10.5 K/uL   RBC 4.41 4.22 - 5.81 MIL/uL   Hemoglobin 13.3 13.0 - 17.0 g/dL   HCT 39.5 39.0 - 52.0 %   MCV 89.6 78.0 - 100.0 fL   MCH 30.2 26.0 - 34.0 pg   MCHC 33.7 30.0 - 36.0 g/dL   RDW 12.5 11.5 - 15.5 %   Platelets 215 150 - 400 K/uL  Lipid panel     Status: Abnormal   Collection Time: 09/20/14  2:42 AM  Result Value Ref Range   Cholesterol 207 (H) 0 - 200 mg/dL   Triglycerides 173 (H) <150 mg/dL   HDL 37 (L) >39 mg/dL   Total CHOL/HDL Ratio 5.6 RATIO   VLDL 35 0 - 40 mg/dL   LDL Cholesterol 135 (H) 0 - 99 mg/dL    Comment:        Total Cholesterol/HDL:CHD Risk Coronary Heart Disease Risk Table                     Men   Women  1/2 Average Risk   3.4   3.3  Average Risk       5.0   4.4  2 X Average Risk   9.6   7.1  3 X Average Risk  23.4   11.0        Use the calculated Patient Ratio above and the CHD Risk Table to determine the patient's CHD Risk.        ATP III CLASSIFICATION (LDL):  <100     mg/dL   Optimal  100-129  mg/dL   Near or Above                    Optimal  130-159  mg/dL   Borderline  160-189  mg/dL   High  >190     mg/dL   Very High   Glucose, capillary     Status: Abnormal   Collection Time: 09/20/14  7:47  AM  Result Value Ref Range   Glucose-Capillary 171 (H) 70 - 99 mg/dL    Imaging: Imaging results have been reviewed  Tele- NSR, short ASx run of NSVT last PM  EKG- NSR 86 with LAFB and early R wave transition C/W posterior MI ( I personally reviewed this EKG)  Assessment/Plan:   1. Principal Problem: 2.  Acute ST elevation myocardial infarction (STEMI) involving other coronary artery of inferior wall 3. Active Problems: 4.   Hypertension 5.   Inferolateral myocardial infarction 6.   Time Spent Directly with Patient:  20 minutes  Length of Stay:  LOS: 1 day   1. Inferolateral STEMI- POD #1 DES X 2 prox and mid/distal LCX with excellent result. Moderately severe segmental distal RCA Dx which will be managed medically. DAPT. BB/ARB/Statin  2. IDDM- CBGs elevated. Adjust Levomir  3. HTN- Undre good control. With NSVT and HR 90s will titrate BB  Tx to tele. [facility]. Prob home in AM.   Quay Burow J 09/20/2014, 9:42 AM

## 2014-09-20 NOTE — Progress Notes (Signed)
CARDIAC REHAB PHASE I   PRE:  Rate/Rhythm: 91 SR    BP: sitting 105/48    SaO2: 100 RA  MODE:  Ambulation: 350 ft   POST:  Rate/Rhythm: 98 SR    BP: sitting 125/60     SaO2: 100 RA  Tolerated well, slow pace. Pt denies c/o. Plan on educating pt with his wife later today.  [phone]   Elissa LovettReeve, Blair Mes[place] CES, ACSM 09/20/2014 10:49 AM

## 2014-09-21 ENCOUNTER — Telehealth: Payer: Self-pay | Admitting: Cardiovascular Disease

## 2014-09-21 LAB — HEMOGLOBIN A1C
HEMOGLOBIN A1C: 11.8 % — AB (ref 4.8–5.6)
Mean Plasma Glucose: 292 mg/dL

## 2014-09-21 LAB — GLUCOSE, CAPILLARY
GLUCOSE-CAPILLARY: 97 mg/dL (ref 70–99)
GLUCOSE-CAPILLARY: 126 mg/dL — AB (ref 70–99)
Glucose-Capillary: 234 mg/dL — ABNORMAL HIGH (ref 70–99)

## 2014-09-21 MED ORDER — LOSARTAN POTASSIUM 25 MG PO TABS
25.0000 mg | ORAL_TABLET | Freq: Every day | ORAL | Status: DC
Start: 2014-09-21 — End: 2014-09-29

## 2014-09-21 MED ORDER — ATORVASTATIN CALCIUM 80 MG PO TABS: 80.0000 mg | ORAL_TABLET | Freq: Every day | ORAL | Status: AC

## 2014-09-21 MED ORDER — TICAGRELOR 90 MG PO TABS: 90.0000 mg | ORAL_TABLET | Freq: Two times a day (BID) | ORAL | Status: AC

## 2014-09-21 MED ORDER — ASPIRIN 81 MG PO CHEW: 81.0000 mg | CHEWABLE_TABLET | Freq: Every day | ORAL | Status: AC

## 2014-09-21 MED ORDER — NITROGLYCERIN 0.4 MG SL SUBL
0.4000 mg | SUBLINGUAL_TABLET | SUBLINGUAL | Status: AC | PRN
Start: 2014-09-21 — End: ?

## 2014-09-21 MED ORDER — METOPROLOL TARTRATE 25 MG PO TABS: 25.0000 mg | ORAL_TABLET | Freq: Two times a day (BID) | ORAL | Status: AC

## 2014-09-21 MED ORDER — LOSARTAN POTASSIUM 25 MG PO TABS
25.0000 mg | ORAL_TABLET | Freq: Every day | ORAL | Status: DC
  Administered 2014-09-21: 25 mg via ORAL
  Filled 2014-09-21: qty 1

## 2014-09-21 MED ORDER — TICAGRELOR 90 MG PO TABS: 90.0000 mg | ORAL_TABLET | Freq: Two times a day (BID) | ORAL | Status: DC

## 2014-09-21 NOTE — Progress Notes (Signed)
    Subjective:  Feels well this morning. Eager for discharge. He does admit to some lightheadedness when he was up walking. No chest pain or shortness of breath.  Objective:  Vital Signs in the last 24 hours: Temp:  [97.5 F (36.4 C)-98 F (36.7 C)] 98 F (36.7 C) (03/31 0458) Pulse Rate:  [68-90] 79 (03/31 0458) Resp:  [16-19] 19 (03/31 0458) BP: (106-136)/(60-80) 106/67 mmHg (03/31 0458) SpO2:  [95 %-100 %] 95 % (03/31 0458) Weight:  [233 lb 3.2 oz (105.779 kg)] 233 lb 3.2 oz (105.779 kg) (03/31 0458)  Intake/Output from previous day: 03/30 0701 - 03/31 0700 In: 480 [P.O.:480] Out: 300 [Urine:300]  Physical Exam: Pt is alert and oriented, NAD HEENT: normal Neck: JVP - normal Lungs: CTA bilaterally CV: RRR without murmur or gallop Abd: soft, NT, Positive BS, no hepatomegaly Ext: no C/C/E, distal pulses intact and equal Skin: warm/dry no rash   Lab Results:  Recent Labs  09/19/14 0746 09/20/14 0242  WBC 6.8 8.6  HGB 12.2* 13.3  PLT 180 215    Recent Labs  09/19/14 0325 09/19/14 0746 09/20/14 0242  NA 131*  --  136  K 3.8  --  3.8  CL 97  --  102  CO2 25  --  28  GLUCOSE 359*  --  210*  BUN 19  --  11  CREATININE 1.29 1.22 1.21    Recent Labs  09/19/14 1305 09/19/14 1828  TROPONINI 50.87* 42.95*   Tele: Personally reviewed: Normal sinus rhythm without significant arrhythmia.  Assessment/Plan:  1. Acute inferolateral STEMI: The patient is stable and ready for hospital discharge. He has received extensive counseling regarding lifestyle modification, medication adherence, and activity restrictions. He was planning on moving to [facility] [place]. He and his wife will stay in town for a hospital follow-up visit late next week and then will move the following week as long as he is doing well. He understands that he needs to avoid heavy lifting. He should continue on dual antiplatelet therapy with aspirin and brilinta for at least 12 months.  2. Essential  hypertension: Blood pressure low this morning. I am going to reduce his losartan from 100 down to 25 mg daily. He will continue on metoprolol 25 mg twice daily. Follow-up next week as planned.  3. Type 2 diabetes, uncontrolled. Hemoglobin A1c was greater than 11 on admission. Levemir has been adjusted. He will need close outpatient follow-up. Blood glucoses have been under good control for the last 24 hours.  4. Hyperlipidemia: The patient has been started on high-dose atorvastatin.  Disposition: Home today, PA/NP follow-up late next week.  Tonny BollmanMichael Tajae Rybicki, M.D. 09/21/2014, 9:54 AM

## 2014-09-21 NOTE — Progress Notes (Signed)
CARDIAC REHAB PHASE I   PRE:  Rate/Rhythm: 83 SR  BP:  Supine: 86/53  Sitting: 88/51  Standing: 107/55   SaO2:   MODE:  Ambulation: 550 ft   POST:  Rate/Rhythm: 97 SR  BP:  Supine:   Sitting: 118/56  Standing:     SaO2:  [id number] Pt stated he was a little lightheaded earlier when brushing teeth. Did orthostatics as documented. Pt walked 550 ft with steady gait and no CP. No c/o dizziness and BP improved with activity. No questions re ed received yesterday.   Luetta Nuttingharlene Cherity Blickenstaff, RN BSN  09/21/2014 9:41 AM

## 2014-09-21 NOTE — Telephone Encounter (Signed)
New message   Per Loretha StaplerBrittaney S  TCM  appt on 4.8.2016 @ 8 :30 am with Dawayne PatriciaLori G.

## 2014-09-21 NOTE — Discharge Summary (Signed)
Physician Discharge Summary  Patient ID: Philip NievesCharles Benassi MRN: [id number] DOB/AGE: 1941-02-10 74 y.o.   Primary Cardiologist: Dr. Excell Seltzerooper  Admit date: 09/19/2014 Discharge date: 09/21/2014  Admission Diagnoses: Inferior STEMI  Discharge Diagnoses:  Principal Problem:   Acute ST elevation myocardial infarction (STEMI) involving other coronary artery of inferior wall Active Problems:   Hypertension   Inferolateral myocardial infarction   Discharged Condition: stable  Hospital Course: The patient is a 74 y/o male with a PMH significant for HTN, HLD and T2DM who [facility] as a Code STEMI.  He had developed severe left scapular pain with associated nausea, vomiting and cold sweats. EKG showed inferior ST elevations in the inferior leads and V5-V6 with reciprocal depressions in aVL and V2. Troponin level peaked to 50.87. He was transported urgently to the cath lab. The procedure was performed by Dr. Excell Seltzerooper. He was found to have total occlusion of the large left circumflex, treated successfully with primary PCI, utilizing a DES. He had residual moderate, severe, diffuse distal RCA stenosis. The LAD was patent with mild diffuse disease.  EF was preserved at 65%. He tolerate the procedure well and left the cath lab in stable condition. He was placed on DAPT with ASA + Brilinta, high dose statin therapy with Lipitor, metoprolol and losartan. He had no recurrent chest pain/scapular pain and no post cath complications. Troponins showed downward trend. His cath access site remained stable, as well as BP, HR and renal function. He had no difficulties ambulating with cardiac rehab. He did require adjustments in his Levemir due to poorly controlled glucose levels. Hgb A1c was >11. Blood glucose levels improved after medication adjustments. Also notable was an abnormal lipid panel which showed elevated LDL of 135 mg/DL. Triglycerides were 173 mg/dL. He will need repeat LFTs and HFTs in 6 weeks.  There were no other issues. He was last seen and examined by Dr. Excell Seltzerooper who determined he was stable for discharge home.  Post hospital follow-up has been arranged with Norma FredricksonLori Gerhardt, NP, on 09/29/14. He was given a free 30 day Brilinta Card.   Consults: None  Significant Diagnostic Studies:  Cardiac Panel (last 3 results)  Recent Labs  09/19/14 0746 09/19/14 1305 09/19/14 1828  TROPONINI 8.80* 50.87* 42.95*   [facility]HC 09/19/14 PROCEDURAL FINDINGS Hemodynamics: AO 112/63 LV 117/22  Coronary angiography: Coronary dominance: right  Left mainstem: The left main is patent without obstructive disease.  Left anterior descending (LAD): The LAD is large in caliber. The vessel courses to the left ventricular apex. There are diffuse irregularities noted. There is no significant stenosis throughout the LAD proper. There are scattered 20-30% stenoses after the first septal perforator. The diagonal branches are small with mild ostial stenoses.  Left circumflex (LCx): There is an intermediate branch with mild diffuse irregularity but no high-grade stenosis. The AV circumflex is subtotally occluded proximally. There is TIMI 2 flow beyond the stenosis. After reopening the vessel, the distal AV circumflex before the second obtuse marginal branch has 75% stenosis. The first OM branch is very small with tight 95% ostial stenosis. The second and third obtuse marginal branches are patent.  Right coronary artery (RCA): This is a diffusely diseased vessel distally. The proximal and mid portions of the vessel are patent. The distal vessel has a long segment diffuse 75-80% stenosis leading into a small PDA branch.  Left ventriculography: There is mild hypokinesis of the distal inferior wall with sparing of the apex. The other LV segments are hyper dynamic.  The estimated LVEF is 65%.   Treatments: See Hospital Course  Discharge Exam: Blood pressure 106/67, pulse 79, temperature 98 F (36.7 C),  temperature source Oral, resp. rate 19, height  (1.854 m), weight 233 lb 3.2 oz (105.779 kg), SpO2 95 %.   Disposition: Home      Discharge Instructions    Diet - low sodium heart healthy    Complete by:  As directed      Increase activity slowly    Complete by:  As directed             Medication List    STOP taking these medications        LANTUS SOLOSTAR 100 UNIT/ML Solostar Pen  Generic drug:  Insulin Glargine      TAKE these medications        aspirin 81 MG chewable tablet  Chew 1 tablet (81 mg total) by mouth daily.     atorvastatin 80 MG tablet  Commonly known as:  LIPITOR  Take 1 tablet (80 mg total) by mouth daily at 6 PM.     LEVEMIR FLEXTOUCH 100 UNIT/ML Pen  Generic drug:  Insulin Detemir  Inject 35 Units into the skin 2 (two) times daily.     levothyroxine 125 MCG tablet  Commonly known as:  SYNTHROID, LEVOTHROID  Take 125 mcg by mouth daily before breakfast.     losartan 25 MG tablet  Commonly known as:  COZAAR  Take 1 tablet (25 mg total) by mouth daily.     metoprolol tartrate 25 MG tablet  Commonly known as:  LOPRESSOR  Take 1 tablet (25 mg total) by mouth 2 (two) times daily.     nitroGLYCERIN 0.4 MG SL tablet  Commonly known as:  NITROSTAT  Place 1 tablet (0.4 mg total) under the tongue every 5 (five) minutes x 3 doses as needed for chest pain.     ticagrelor 90 MG Tabs tablet  Commonly known as:  BRILINTA  Take 1 tablet (90 mg total) by mouth 2 (two) times daily.     ticagrelor 90 MG Tabs tablet  Commonly known as:  BRILINTA  Take 1 tablet (90 mg total) by mouth 2 (two) times daily.       Follow-up Information    Follow up with Norma Fredrickson, NP On 09/29/2014.   Specialty:  Nurse Practitioner   Why:  8:30 am    Contact information:   [address] [place] [address] [phone]       TIME SPENT ON DISCHARGE, INCLUDING PHYSICIAN TIME: >30 MINUTES  Signed: Robbie Lis 09/21/2014, 10:42  AM

## 2014-09-22 NOTE — Telephone Encounter (Signed)
Patient contacted regarding discharge from [facility]  Patient understands to follow up with provider ? On 4/8 with Norma FredricksonLori Gerhardt at 8:30 Patient understands discharge instructions? Yes  Patient understands medications and regiment? Yes  Patient understands to bring all medications to this visit? Yes   Pt had questions about Cozaar as he was taking 100 mg before and now told to take 25 mg, pt want to make sure he is taking correct dose.  Pt advised 25 mg is the correct dose and to take the 100 mg tablets away from other medication so it is not confused.  Pt reminded up Insulin changes to stop using the Lantus pen, pt agreed and aware of change.  Pt wife at side and had questions on pt diet.  Recommend that pt be mindful of salt intake and intake in moderation, not to over do it.  Pt and wife verbalized understanding.  Pt aware of appointment and verified office address and phone number.

## 2014-09-29 ENCOUNTER — Ambulatory Visit (INDEPENDENT_AMBULATORY_CARE_PROVIDER_SITE_OTHER): Payer: Medicare Other | Admitting: Nurse Practitioner

## 2014-09-29 ENCOUNTER — Encounter: Payer: Self-pay | Admitting: Nurse Practitioner

## 2014-09-29 VITALS — BP 130/70 | HR 90 | Resp 16 | Ht 73.0 in | Wt 239.0 lb

## 2014-09-29 DIAGNOSIS — E785 Hyperlipidemia, unspecified: Secondary | ICD-10-CM | POA: Diagnosis not present

## 2014-09-29 DIAGNOSIS — I213 ST elevation (STEMI) myocardial infarction of unspecified site: Secondary | ICD-10-CM

## 2014-09-29 DIAGNOSIS — Z955 Presence of coronary angioplasty implant and graft: Secondary | ICD-10-CM | POA: Diagnosis not present

## 2014-09-29 LAB — CBC
HCT: 40 % (ref 39.0–52.0)
Hemoglobin: 13.4 g/dL (ref 13.0–17.0)
MCHC: 33.5 g/dL (ref 30.0–36.0)
MCV: 90.7 fl (ref 78.0–100.0)
Platelets: 294 10*3/uL (ref 150.0–400.0)
RBC: 4.41 Mil/uL (ref 4.22–5.81)
RDW: 13 % (ref 11.5–15.5)
WBC: 7.5 10*3/uL (ref 4.0–10.5)

## 2014-09-29 LAB — BASIC METABOLIC PANEL
BUN: 26 mg/dL — ABNORMAL HIGH (ref 6–23)
CO2: 22 mEq/L (ref 19–32)
Calcium: 9.7 mg/dL (ref 8.4–10.5)
Chloride: 103 mEq/L (ref 96–112)
Creatinine, Ser: 1.77 mg/dL — ABNORMAL HIGH (ref 0.40–1.50)
GFR: 48.53 mL/min — ABNORMAL LOW (ref >60.00)
Glucose, Bld: 143 mg/dL — ABNORMAL HIGH (ref 70–99)
Potassium: 4 mEq/L (ref 3.5–5.1)
Sodium: 134 mEq/L — ABNORMAL LOW (ref 135–145)

## 2014-09-29 MED ORDER — LOSARTAN POTASSIUM 100 MG PO TABS: 100.0000 mg | ORAL_TABLET | Freq: Every day | ORAL | Status: AC

## 2014-09-29 NOTE — Patient Instructions (Addendum)
We will be checking the following labs today BMET and CBC  You will need to be seen in about 6 weeks with fasting labwork  [place] get [place] [facility] - you will need a primary care doctor and a cardiologist  Work towards walking 45 minutes a day  Call the [facility] office at [phone] if you have any questions, problems or concerns.

## 2014-09-29 NOTE — Progress Notes (Signed)
CARDIOLOGY OFFICE NOTE  Date:  09/29/2014    [name] Date of Birth: 11/01/1940 Medical Record #[id number]  PCP:  Iran Planas, MD  Cardiologist:  Burt Knack    Chief Complaint  Patient presents with  . Coronary Artery Disease    TOC visit/post hospital - recent STEMI - seen for Dr. Burt Knack     History of Present Illness: [name] is a 74 y.o. male who presents today for a TOC/post hospital visit. He is seen for Dr. Burt Knack. He has a PMH significant for HTN, HLD and T2DM.   He presented to [facility] on 09/19/14 as a Code STEMI. He had developed severe left scapular pain with associated nausea, vomiting and cold sweats. EKG showed inferior ST elevations in the inferior leads and V5-V6 with reciprocal depressions in aVL and V2. Troponin level peaked to 50.87. He was transported urgently to the cath lab. The procedure was performed by Dr. Burt Knack. He was found to have total occlusion of the large left circumflex, treated successfully with primary PCI, utilizing a DES. He had residual moderate, severe, diffuse distal RCA stenosis. The LAD was patent with mild diffuse disease. EF was preserved at 65%. He tolerate the procedure well and left the cath lab in stable condition. He was placed on DAPT with ASA + Brilinta, high dose statin therapy with Lipitor, metoprolol and losartan. He did require adjustments in his Levemir due to poorly controlled glucose levels. Hgb A1c was >11.   Comes back today. Here with his wife. Doing well. They were in the process of moving back to [facility] when he had his MI. Planning on moving back next Friday. He is doing well. No chest pain. Not short of breath. Says he only had 5 pills of the 25 mg of Losartan - has already gone back to his 100 mg tablet. Not dizzy or lightheaded. BP is ok. Walking some. No problems with his cath site.     Past Medical History  Diagnosis Date  . Diabetes mellitus without complication   . Hypothyroidism     Past Surgical  History  Procedure Laterality Date  . Hernia repair    . Appendectomy    . Thyroid surgery    . Cardiac catheterization  09/19/2014    Procedure: LEFT HEART CATH AND CORONARY ANGIOGRAPHY;  Surgeon: Sherren Mocha, MD;  Location: [facility] CATH LAB;  Service: Cardiovascular;;  . Percutaneous coronary stent intervention (pci-s)  09/19/2014    Procedure: PERCUTANEOUS CORONARY STENT INTERVENTION (PCI-S);  Surgeon: Sherren Mocha, MD;  Location: [facility] CATH LAB;  Service: Cardiovascular;;     Medications: Current Outpatient Prescriptions  Medication Sig Dispense Refill  . aspirin 81 MG chewable tablet Chew 1 tablet (81 mg total) by mouth daily.    Marland Kitchen atorvastatin (LIPITOR) 80 MG tablet Take 1 tablet (80 mg total) by mouth daily at 6 PM. 30 tablet 5  . LANTUS SOLOSTAR 100 UNIT/ML Solostar Pen     . latanoprost (XALATAN) 0.005 % ophthalmic solution     . LEVEMIR FLEXTOUCH 100 UNIT/ML Pen Inject 35 Units into the skin 2 (two) times daily.    Marland Kitchen levothyroxine (SYNTHROID, LEVOTHROID) 125 MCG tablet Take 125 mcg by mouth daily before breakfast.    . losartan (COZAAR) 25 MG tablet Take 1 tablet (25 mg total) by mouth daily. 5 tablet 0  . metoprolol tartrate (LOPRESSOR) 25 MG tablet Take 1 tablet (25 mg total) by mouth 2 (two) times daily. 60 tablet 5  . nitroGLYCERIN (NITROSTAT)  0.4 MG SL tablet Place 1 tablet (0.4 mg total) under the tongue every 5 (five) minutes x 3 doses as needed for chest pain. 25 tablet 2  . omeprazole (PRILOSEC) 40 MG capsule Take 40 mg by mouth.    . ticagrelor (BRILINTA) 90 MG TABS tablet Take 1 tablet (90 mg total) by mouth 2 (two) times daily. 60 tablet 10   No current facility-administered medications for this visit.    Allergies: Allergies  Allergen Reactions  . Sulfa Antibiotics     Social History: The patient  reports that he has never smoked. He does not have any smokeless tobacco history on file. He reports that he drinks alcohol.   Family History: The patient's family  history is not on file.   Review of Systems: Please see the history of present illness.      All other systems are reviewed and negative.   Physical Exam: VS:  BP 130/70 mmHg  Pulse 90  Resp 16  Ht 6' 1"  (1.854 m)  Wt 239 lb (108.41 kg)  BMI 31.54 kg/m2  SpO2 97% .  BMI Body mass index is 31.54 kg/(m^2).  Wt Readings from Last 3 Encounters:  09/29/14 239 lb (108.41 kg)  09/21/14 233 lb 3.2 oz (105.779 kg)    General: Pleasant. Well developed, well nourished and in no acute distress.  HEENT: Normal. Neck: Supple, no JVD, carotid bruits, or masses noted.  Cardiac: Regular rate and rhythm. No murmurs, rubs, or gallops. No edema.  Respiratory:  Lungs are clear to auscultation bilaterally with normal work of breathing.  GI: Soft and nontender.  MS: No deformity or atrophy. Gait and ROM intact. Skin: Warm and dry. Color is normal.  Neuro:  Strength and sensation are intact and no gross focal deficits noted.  Psych: Alert, appropriate and with normal affect.   LABORATORY DATA:  EKG:  EKG is not ordered today.  Lab Results  Component Value Date   WBC 8.6 09/20/2014   HGB 13.3 09/20/2014   HCT 39.5 09/20/2014   PLT 215 09/20/2014   GLUCOSE 210* 09/20/2014   CHOL 207* 09/20/2014   TRIG 173* 09/20/2014   HDL 37* 09/20/2014   LDLCALC 135* 09/20/2014   ALT 49 03/20/2007   AST 37 03/20/2007   NA 136 09/20/2014   K 3.8 09/20/2014   CL 102 09/20/2014   CREATININE 1.21 09/20/2014   BUN 11 09/20/2014   CO2 28 09/20/2014   TSH 0.412 09/19/2014   HGBA1C 11.8* 09/20/2014    BNP (last 3 results)  Recent Labs  09/19/14 0746  BNP 26.0    ProBNP (last 3 results) No results for input(s): PROBNP in the last 8760 hours.   Other Studies Reviewed Today: Coronary angiography: Coronary dominance: right  Left mainstem: The left main is patent without obstructive disease.  Left anterior descending (LAD): The LAD is large in caliber. The vessel courses to the left ventricular  apex. There are diffuse irregularities noted. There is no significant stenosis throughout the LAD proper. There are scattered 20-30% stenoses after the first septal perforator. The diagonal branches are small with mild ostial stenoses.  Left circumflex (LCx): There is an intermediate branch with mild diffuse irregularity but no high-grade stenosis. The AV circumflex is subtotally occluded proximally. There is TIMI 2 flow beyond the stenosis. After reopening the vessel, the distal AV circumflex before the second obtuse marginal branch has 75% stenosis. The first OM branch is very small with tight 95% ostial stenosis. The second  and third obtuse marginal branches are patent.  Right coronary artery (RCA): This is a diffusely diseased vessel distally. The proximal and mid portions of the vessel are patent. The distal vessel has a long segment diffuse 75-80% stenosis leading into a small PDA branch.  Left ventriculography: There is mild hypokinesis of the distal inferior wall with sparing of the apex. The other LV segments are hyper dynamic. The estimated LVEF is 65%.  PCI Note: Following the diagnostic procedure, the decision was made to proceed with PCI of the proximal circumflex. The patient had received IV heparin prior to arrival. He was loaded with brilinta 180 mg orally on the table. Weight-based bivalirudin was given for anticoagulation. Once a therapeutic ACT was achieved, a 6 [place] JL 3.5 cm guide catheter was inserted. A cougar coronary guidewire was used to cross the lesion. The lesion was predilated with a 2.5 mm balloon. The lesion was then stented with a 3.5x18 mm Xience DES (14 atm). The stent was postdilated with a 3.75 mm noncompliant balloon (14 atm). Following PCI, there was 0% residual stenosis and TIMI-3 flow. Beyond the lesion in the distal circumflex there was an eccentric 75% stenosis. [place] was primarily stented with a 3.25 x 13 mm Xience DES (14 atm). Final angiography confirmed an  excellent result. The patient tolerated the procedure well. There were no immediate procedural complications. A TR band was used for radial hemostasis. The patient was transferred to the post catheterization recovery area for further monitoring.  PCI Data: Lesion 1 Vessel - LCx/Segment - prox Percent Stenosis (pre) 99 TIMI-flow 2 Stent 3.5x18 mm Xience DES Percent Stenosis (post) 0 TIMI-flow (post) 3  Lesion 2 Vessel - LCx/Segment - distal Percent Stenosis (pre) 75 TIMI-flow 3 Stent 3.25x13 mm Xience DES Percent Stenosis (post) 0 TIMI-flow (post) 3  Estimated Blood Loss: minimal  Final Conclusions:  1. Acute inferolateral STEMI secondary to total occlusion of the large left circumflex, treated successfully with primary PCI  2. Moderately severe diffuse distal RCA stenosis (small caliber distal vessel)  3. Patent LAD with mild diffuse disease  4. Mild contraction abnormality the left ventricle with overall well-preserved LVEF   Recommendations:  Aggressive medical therapy for risk reduction. Dual antiplatelet therapy with aspirin and brilinta for at least 12 months. Medical therapy for her residual CAD (diffuse distal diabetic pattern). Anticipate hospital discharge in 48 hours unless complications arise.  Sherren Mocha MD, [facility] 09/19/2014, 6:44 AM   Assessment/Plan: 1. STEMI - inferolateral with PCI of a totally occluded LCX - doing well clinically. Check follow up BMET and CBC today. They will be leaving  next week - will need to get established in [place] with primary care and cardiology.  2. Residual RCA disease - to manage medically -  No symptoms  3.  HLD  4. Uncontrolled DM - may benefit from endocrine referral.   5. HTN -  BP looks ok.   Current medicines are reviewed with the patient today.  The patient does not have concerns regarding medicines other than what has been noted above.  The following changes have been made:  See above.  Labs/  tests ordered today include:    Orders Placed This Encounter  Procedures  . Basic metabolic panel  . CBC     Disposition:   Will see back prn.   Patient is agreeable to this plan and will call if any problems develop in the interim.   Signed: Burtis Junes, RN, ANP-C 09/29/2014 8:58 AM  [facility] [address] [place], [place]  [address] Phone: [phone] Fax: [phone]

## 2014-10-03 ENCOUNTER — Other Ambulatory Visit: Payer: Self-pay

## 2014-10-03 DIAGNOSIS — R899 Unspecified abnormal finding in specimens from other organs, systems and tissues: Secondary | ICD-10-CM

## 2014-10-04 ENCOUNTER — Other Ambulatory Visit (INDEPENDENT_AMBULATORY_CARE_PROVIDER_SITE_OTHER): Payer: Medicare Other | Admitting: *Deleted

## 2014-10-04 DIAGNOSIS — R899 Unspecified abnormal finding in specimens from other organs, systems and tissues: Secondary | ICD-10-CM

## 2014-10-04 LAB — BASIC METABOLIC PANEL
BUN: 28 mg/dL — ABNORMAL HIGH (ref 6–23)
CO2: 28 mEq/L (ref 19–32)
Calcium: 9.6 mg/dL (ref 8.4–10.5)
Chloride: 103 mEq/L (ref 96–112)
Creatinine, Ser: 1.73 mg/dL — ABNORMAL HIGH (ref 0.40–1.50)
GFR: 49.82 mL/min — ABNORMAL LOW (ref >60.00)
Glucose, Bld: 120 mg/dL — ABNORMAL HIGH (ref 70–99)
Potassium: 4.2 mEq/L (ref 3.5–5.1)
Sodium: 136 mEq/L (ref 135–145)

## 2014-10-05 ENCOUNTER — Other Ambulatory Visit: Payer: Medicare Other

## 2015-07-05 IMAGING — CR DG CHEST 1V PORT
1 series · 1 of 1 positions shown · non-contrast
Comparison: Chest radiograph performed 03/20/2007

CLINICAL DATA: Acute onset of upper back pain, with vomiting and
weakness. Initial encounter.

EXAM:
PORTABLE CHEST - 1 VIEW

[portable]
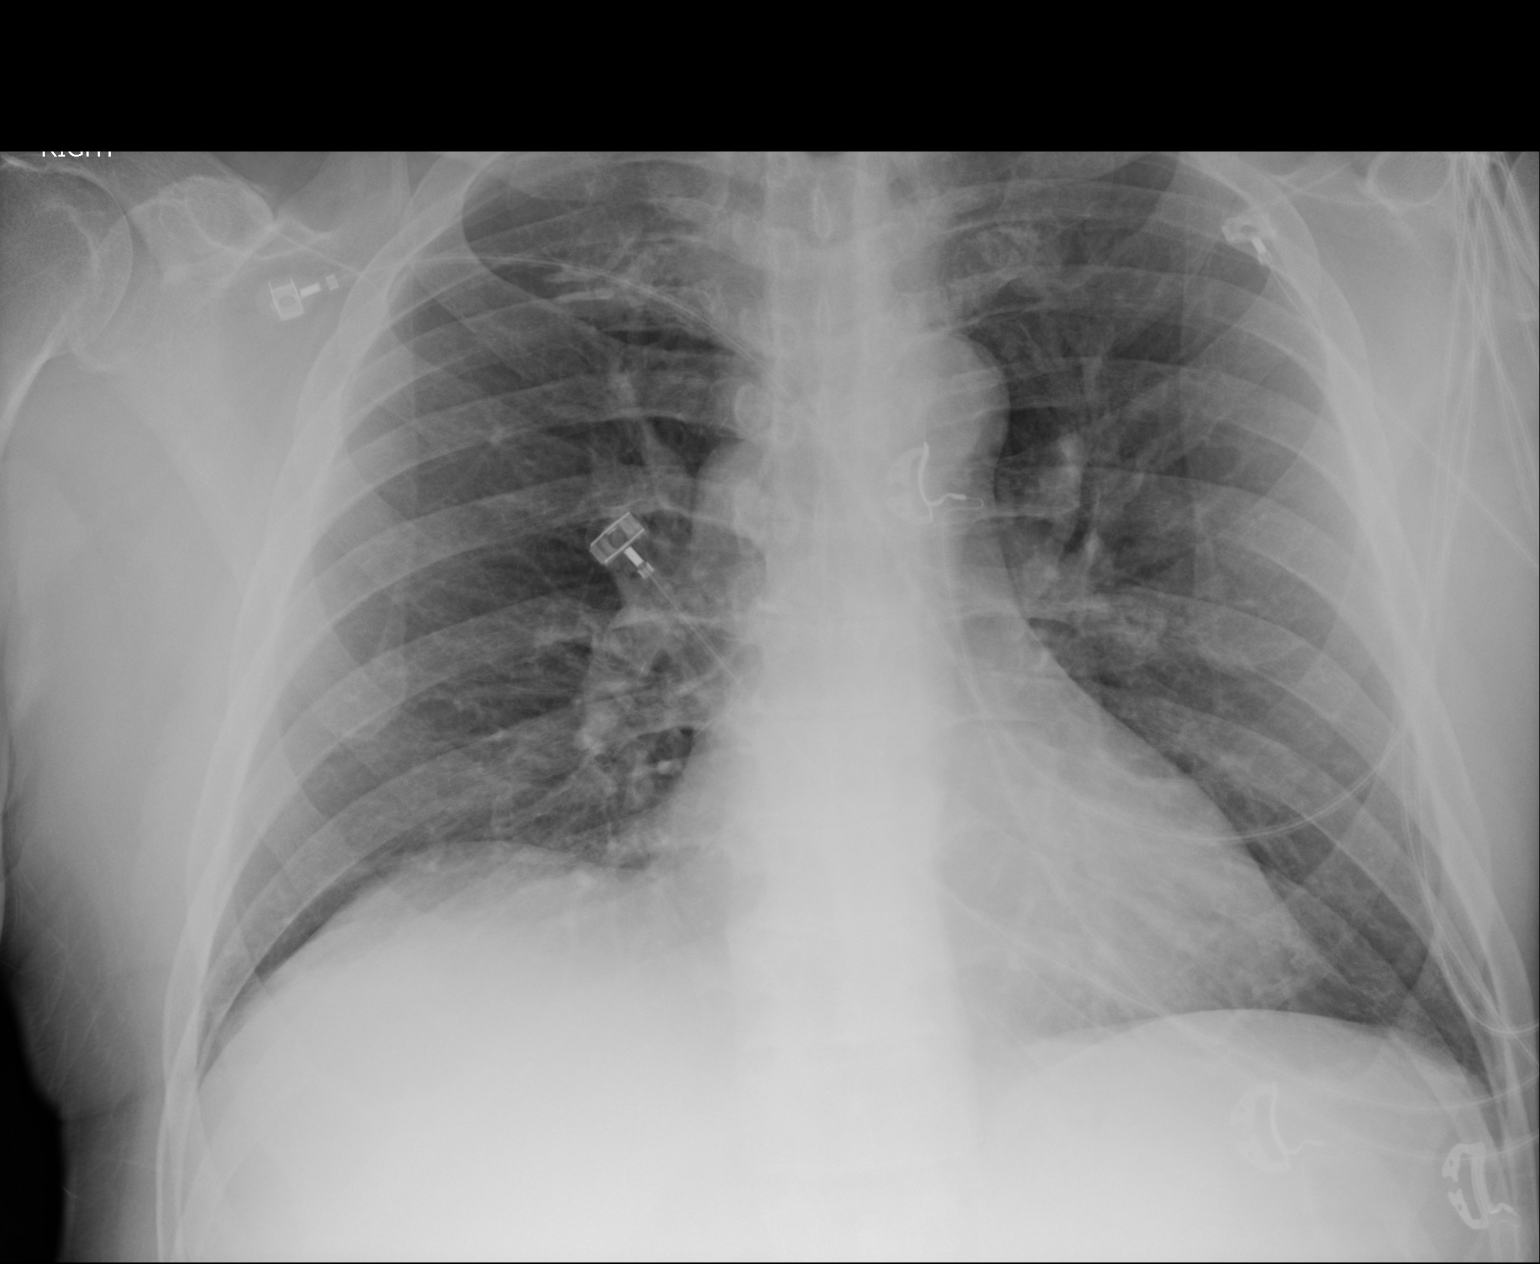

[1 of 1 positions shown; findings below may reference images not displayed]

FINDINGS: The lungs are well-aerated. Mild vascular congestion is noted. There
is no evidence of focal opacification, pleural effusion or
pneumothorax.

The cardiomediastinal silhouette is within normal limits. No acute
osseous abnormalities are seen.
IMPRESSION: Mild vascular congestion noted; lungs remain grossly clear.
